# Patient Record
Sex: Female | Born: 2000 | Hispanic: Yes | Marital: Married | Smoking: Never smoker
Health system: Southern US, Community
[De-identification: ages and names within clinical notes are randomized; demographics above are authoritative.]

## PROBLEM LIST (undated history)

## (undated) DIAGNOSIS — R7303 Prediabetes: Secondary | ICD-10-CM

## (undated) HISTORY — DX: Prediabetes: R73.03

## (undated) HISTORY — PX: TONSILLECTOMY: SUR1361

---
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
## 2019-01-10 DIAGNOSIS — Z3400 Encounter for supervision of normal first pregnancy, unspecified trimester: Secondary | ICD-10-CM | POA: Diagnosis not present

## 2019-02-27 DIAGNOSIS — Z3A35 35 weeks gestation of pregnancy: Secondary | ICD-10-CM | POA: Diagnosis not present

## 2019-02-27 DIAGNOSIS — Z3403 Encounter for supervision of normal first pregnancy, third trimester: Secondary | ICD-10-CM | POA: Diagnosis not present

## 2019-03-07 DIAGNOSIS — O26843 Uterine size-date discrepancy, third trimester: Secondary | ICD-10-CM | POA: Diagnosis not present

## 2019-03-24 DIAGNOSIS — O99713 Diseases of the skin and subcutaneous tissue complicating pregnancy, third trimester: Secondary | ICD-10-CM | POA: Diagnosis not present

## 2019-03-24 DIAGNOSIS — L299 Pruritus, unspecified: Secondary | ICD-10-CM | POA: Diagnosis not present

## 2019-03-27 DIAGNOSIS — K831 Obstruction of bile duct: Secondary | ICD-10-CM | POA: Diagnosis not present

## 2019-03-27 DIAGNOSIS — O26613 Liver and biliary tract disorders in pregnancy, third trimester: Secondary | ICD-10-CM | POA: Diagnosis not present

## 2019-03-29 DIAGNOSIS — O9852 Other viral diseases complicating childbirth: Secondary | ICD-10-CM | POA: Diagnosis not present

## 2019-03-29 DIAGNOSIS — O26619 Liver and biliary tract disorders in pregnancy, unspecified trimester: Secondary | ICD-10-CM | POA: Diagnosis not present

## 2019-03-29 DIAGNOSIS — K831 Obstruction of bile duct: Secondary | ICD-10-CM | POA: Diagnosis not present

## 2019-06-19 ENCOUNTER — Other Ambulatory Visit: Payer: Self-pay

## 2019-06-19 ENCOUNTER — Encounter: Payer: Self-pay | Admitting: Emergency Medicine

## 2019-06-19 DIAGNOSIS — R509 Fever, unspecified: Secondary | ICD-10-CM | POA: Diagnosis present

## 2019-06-19 DIAGNOSIS — Z79899 Other long term (current) drug therapy: Secondary | ICD-10-CM | POA: Diagnosis not present

## 2019-06-19 DIAGNOSIS — Z20828 Contact with and (suspected) exposure to other viral communicable diseases: Secondary | ICD-10-CM | POA: Insufficient documentation

## 2019-06-19 DIAGNOSIS — J069 Acute upper respiratory infection, unspecified: Secondary | ICD-10-CM | POA: Insufficient documentation

## 2019-06-19 DIAGNOSIS — E860 Dehydration: Secondary | ICD-10-CM | POA: Diagnosis not present

## 2019-06-19 DIAGNOSIS — R531 Weakness: Secondary | ICD-10-CM | POA: Diagnosis not present

## 2019-06-19 LAB — URINALYSIS, COMPLETE (UACMP) WITH MICROSCOPIC
Bacteria, UA: NONE SEEN
Bilirubin Urine: NEGATIVE
Glucose, UA: NEGATIVE mg/dL
Hgb urine dipstick: NEGATIVE
Ketones, ur: NEGATIVE mg/dL
Nitrite: NEGATIVE
Protein, ur: NEGATIVE mg/dL
Specific Gravity, Urine: 1.018 (ref 1.005–1.030)
pH: 7 (ref 5.0–8.0)

## 2019-06-19 LAB — CBC WITH DIFFERENTIAL/PLATELET
Abs Immature Granulocytes: 0.02 10*3/uL (ref 0.00–0.07)
Basophils Absolute: 0 10*3/uL (ref 0.0–0.1)
Basophils Relative: 0 %
Eosinophils Absolute: 0 10*3/uL (ref 0.0–0.5)
Eosinophils Relative: 0 %
HCT: 40.7 % (ref 36.0–46.0)
Hemoglobin: 13.5 g/dL (ref 12.0–15.0)
Immature Granulocytes: 0 %
Lymphocytes Relative: 13 %
Lymphs Abs: 0.9 10*3/uL (ref 0.7–4.0)
MCH: 28.7 pg (ref 26.0–34.0)
MCHC: 33.2 g/dL (ref 30.0–36.0)
MCV: 86.4 fL (ref 80.0–100.0)
Monocytes Absolute: 0.5 10*3/uL (ref 0.1–1.0)
Monocytes Relative: 7 %
Neutro Abs: 5.4 10*3/uL (ref 1.7–7.7)
Neutrophils Relative %: 80 %
Platelets: 302 10*3/uL (ref 150–400)
RBC: 4.71 MIL/uL (ref 3.87–5.11)
RDW: 11.7 % (ref 11.5–15.5)
WBC: 6.8 10*3/uL (ref 4.0–10.5)
nRBC: 0 % (ref 0.0–0.2)

## 2019-06-19 LAB — BASIC METABOLIC PANEL
Anion gap: 8 (ref 5–15)
BUN: 12 mg/dL (ref 6–20)
CO2: 25 mmol/L (ref 22–32)
Calcium: 11.9 mg/dL — ABNORMAL HIGH (ref 8.9–10.3)
Chloride: 104 mmol/L (ref 98–111)
Creatinine, Ser: 0.83 mg/dL (ref 0.44–1.00)
GFR calc Af Amer: >60 mL/min (ref >60)
GFR calc non Af Amer: >60 mL/min (ref >60)
Glucose, Bld: 104 mg/dL — ABNORMAL HIGH (ref 70–99)
Potassium: 3.8 mmol/L (ref 3.5–5.1)
Sodium: 137 mmol/L (ref 135–145)

## 2019-06-19 LAB — POCT PREGNANCY, URINE: Preg Test, Ur: NEGATIVE

## 2019-06-19 MED ORDER — ACETAMINOPHEN 325 MG PO TABS
650.0000 mg | ORAL_TABLET | Freq: Once | ORAL | Status: AC | PRN
  Administered 2019-06-19: 650 mg via ORAL
  Filled 2019-06-19: qty 2

## 2019-06-19 NOTE — ED Triage Notes (Addendum)
Pt present to ED sudden onset of body aches, dizziness, chills, and fever this morning. +nausea. Denies vomiting. Denies exposure to anyone who is sick. +cough ("a little"). Pt states she did test positive for COVID when she went into labor (almost 3 months ago) but had no symptoms.

## 2019-06-20 ENCOUNTER — Emergency Department: Payer: Medicaid Other

## 2019-06-20 ENCOUNTER — Emergency Department
Admission: EM | Admit: 2019-06-20 | Discharge: 2019-06-20 | Disposition: A | Discharge status: Home / Self Care | Payer: Medicaid Other | Attending: Emergency Medicine | Admitting: Emergency Medicine

## 2019-06-20 ENCOUNTER — Telehealth: Payer: Self-pay | Admitting: General Practice

## 2019-06-20 DIAGNOSIS — J069 Acute upper respiratory infection, unspecified: Secondary | ICD-10-CM

## 2019-06-20 DIAGNOSIS — R509 Fever, unspecified: Secondary | ICD-10-CM | POA: Diagnosis not present

## 2019-06-20 DIAGNOSIS — E860 Dehydration: Secondary | ICD-10-CM

## 2019-06-20 DIAGNOSIS — R05 Cough: Secondary | ICD-10-CM | POA: Diagnosis not present

## 2019-06-20 LAB — HEPATIC FUNCTION PANEL
ALT: 40 U/L (ref 0–44)
AST: 39 U/L (ref 15–41)
Albumin: 4.5 g/dL (ref 3.5–5.0)
Alkaline Phosphatase: 152 U/L — ABNORMAL HIGH (ref 38–126)
Bilirubin, Direct: <0.1 mg/dL (ref 0.0–0.2)
Total Bilirubin: 0.7 mg/dL (ref 0.3–1.2)
Total Protein: 7.9 g/dL (ref 6.5–8.1)

## 2019-06-20 LAB — SARS CORONAVIRUS 2 BY RT PCR (HOSPITAL ORDER, PERFORMED IN ~~LOC~~ HOSPITAL LAB): SARS Coronavirus 2: NEGATIVE

## 2019-06-20 MED ORDER — AZITHROMYCIN 250 MG PO TABS: ORAL_TABLET | ORAL | 0 refills | Status: DC

## 2019-06-20 MED ORDER — BENZONATATE 100 MG PO CAPS: 100.0000 mg | ORAL_CAPSULE | Freq: Three times a day (TID) | ORAL | 0 refills | Status: DC | PRN

## 2019-06-20 MED ORDER — ONDANSETRON 4 MG PO TBDP: 4.0000 mg | ORAL_TABLET | Freq: Three times a day (TID) | ORAL | 0 refills | Status: DC | PRN

## 2019-06-20 NOTE — Telephone Encounter (Signed)
Negative COVID results given. Patient results "NOT Detected." Caller expressed understanding. ° °

## 2019-06-20 NOTE — ED Provider Notes (Signed)
[facility] Emergency Department Provider Note  ____________________________________________   First MD Initiated Contact with Patient 06/20/19 0320     (approximate)  I have reviewed the triage vital signs and the nursing notes.   HISTORY  Chief Complaint Fever, Chills, Generalized Body Aches, and Dizziness    HPI Kara House is a 18 y.o. female here with fever and chills.  The patient states that that she was in her usual state of health until this morning.  She developed gradual onset of progressively worsening chills, body aches, and mild cough.  The symptoms seemed to begin after she was drinking coffee, but she denies any choking.  She states that intermittently, she has felt these recurrent chills and fever.  She is had some mild myalgias as well.  She denies any known sick contacts.  She does have a 20-month-old child, but the child is not in daycare.  She denies any vaginal bleeding or discharge.  She is had no complications after delivery.  She not thinks any urinary symptoms.  She does have a reported history of pneumonia, which she states felt somewhat similar to this.  No known coronavirus exposures.  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  She had no symptoms at that time.        History reviewed. No pertinent past medical history.  There are no active problems to display for this patient.   Past Surgical History:  Procedure Laterality Date  . TONSILLECTOMY      Prior to Admission medications   Medication Sig Start Date End Date Taking? Authorizing Provider  azithromycin (ZITHROMAX Z-PAK) 250 MG tablet Take 2 tablets (500 mg) on  Day 1,  followed by 1 tablet (250 mg) once daily on Days 2 through 5. 06/20/19   Duffy Bruce, MD  benzonatate (TESSALON PERLES) 100 MG capsule Take 1 capsule (100 mg total) by mouth 3 (three) times daily as needed for cough. 06/20/19 06/19/20  Duffy Bruce, MD   ondansetron (ZOFRAN ODT) 4 MG disintegrating tablet Take 1 tablet (4 mg total) by mouth every 8 (eight) hours as needed for nausea or vomiting. 06/20/19   Duffy Bruce, MD    Allergies Patient has no known allergies.  No family history on file.  Social History Social History   Tobacco Use  . Smoking status: Never Smoker  . Smokeless tobacco: Never Used  Substance Use Topics  . Alcohol use: Yes    Frequency: Never  . Drug use: Not on file    Review of Systems  Review of Systems  Constitutional: Positive for chills, fatigue and fever.  HENT: Negative for congestion and sore throat.   Eyes: Negative for visual disturbance.  Respiratory: Positive for cough. Negative for shortness of breath.   Cardiovascular: Negative for chest pain.  Gastrointestinal: Positive for nausea. Negative for abdominal pain, diarrhea and vomiting.  Genitourinary: Negative for flank pain.  Musculoskeletal: Negative for back pain and neck pain.  Skin: Negative for rash and wound.  Neurological: Positive for weakness.  All other systems reviewed and are negative.    ____________________________________________  PHYSICAL EXAM:      VITAL SIGNS: ED Triage Vitals  Enc Vitals Group     BP 06/19/19 2234 131/68     Pulse Rate 06/19/19 2234 (!) 138     Resp 06/19/19 2234 18     Temp 06/19/19 2234 (!) 102.8 F (39.3 C)     Temp Source  06/19/19 2234 Oral     SpO2 06/19/19 2234 99 %     Weight 06/19/19 2239 181 lb (82.1 kg)     Height 06/19/19 2239 5\' 2"  (1.575 m)     Head Circumference --      Peak Flow --      Pain Score 06/19/19 2238 10     Pain Loc --      Pain Edu? --      Excl. in GC? --      Physical Exam Vitals signs and nursing note reviewed.  Constitutional:      General: She is not in acute distress.    Appearance: She is well-developed.  HENT:     Head: Normocephalic and atraumatic.  Eyes:     Conjunctiva/sclera: Conjunctivae normal.  Neck:     Musculoskeletal: Neck supple.   Cardiovascular:     Rate and Rhythm: Regular rhythm. Tachycardia present.     Heart sounds: Normal heart sounds. No murmur. No friction rub.  Pulmonary:     Effort: Pulmonary effort is normal. No respiratory distress.     Breath sounds: Rales (mild, basilar) present. No wheezing.  Abdominal:     General: There is no distension.     Palpations: Abdomen is soft.     Tenderness: There is no abdominal tenderness.     Comments: No focal TTP. Specifically, no RUQ or RLQ TTP.  Skin:    General: Skin is warm.     Capillary Refill: Capillary refill takes less than 2 seconds.  Neurological:     Mental Status: She is alert and oriented to person, place, and time.     Motor: No abnormal muscle tone.       ____________________________________________   LABS (all labs ordered are listed, but only abnormal results are displayed)  Labs Reviewed  BASIC METABOLIC PANEL - Abnormal; Notable for the following components:      Result Value   Glucose, Bld 104 (*)    Calcium 11.9 (*)    All other components within normal limits  URINALYSIS, COMPLETE (UACMP) WITH MICROSCOPIC - Abnormal; Notable for the following components:   Color, Urine YELLOW (*)    APPearance HAZY (*)    Leukocytes,Ua MODERATE (*)    All other components within normal limits  HEPATIC FUNCTION PANEL - Abnormal; Notable for the following components:   Alkaline Phosphatase 152 (*)    All other components within normal limits  SARS CORONAVIRUS 2 (HOSPITAL ORDER, PERFORMED IN [facility])  CBC WITH DIFFERENTIAL/PLATELET  POC URINE PREG, ED  POCT PREGNANCY, URINE    ____________________________________________  EKG: Sinus tachycardia, VR 132. PR 132, QRS 106. No acute ischemic changes. No RBBB or RAD. ________________________________________  RADIOLOGY All imaging, including plain films, CT scans, and ultrasounds, independently reviewed by me, and interpretations confirmed via formal radiology reads.  ED MD  interpretation:   CXR: Clear, no PNA  Official radiology report(s): Dg Chest 1 View  Result Date: 06/20/2019 CLINICAL DATA:  18 year old female with sudden onset fever, cough, body ache, dizziness. Three months ago was positive for COVID-19. EXAM: CHEST  1 VIEW COMPARISON:  None. FINDINGS: Portable AP upright view at 0337 hours. Lung volumes and mediastinal contours are within normal limits. Visualized tracheal air column is within normal limits. Allowing for portable technique the lungs are clear. No osseous abnormality identified. Negative visible bowel gas pattern. IMPRESSION: Negative portable chest. Electronically Signed   By: Odessa FlemingH  Hall M.D.   On: 06/20/2019  04:23    ____________________________________________  PROCEDURES   Procedure(s) performed (including Critical Care):  Procedures  ____________________________________________  INITIAL IMPRESSION / MDM / ASSESSMENT AND PLAN / ED COURSE  As part of my medical decision making, I reviewed the following data within the electronic MEDICAL RECORD NUMBER Notes from prior ED visits and [place] Controlled Substance Database      *Madissyn Roberds was evaluated in Emergency Department on 06/20/2019 for the symptoms described in the history of present illness. She was evaluated in the context of the global COVID-19 pandemic, which necessitated consideration that the patient might be at risk for infection with the SARS-CoV-2 virus that causes COVID-19. Institutional protocols and algorithms that pertain to the evaluation of patients at risk for COVID-19 are in a state of rapid change based on information released by regulatory bodies including the CDC and federal and state organizations. These policies and algorithms were followed during the patient's care in the ED.  Some ED evaluations and interventions may be delayed as a result of limited staffing during the pandemic.*   Clinical Course as of Jun 19 534  Tue Jun 20, 2019  0506 18 yo F here with  cough, fever, chills. Pt febrile, tachycardic on arrival but not hypoxic. Suspect viral illness, possible early CAP. COVID-19 a primary consideration as well. No signs of resp distress or failure. UA with mild pyuria but she has absolutely no urinary sx - doubt UTI. No vaginal bleeding, discharge, or sx of PID. CBC, BMP normal. LFTs normal, no abd TTP to suggest cholecystitis, appendicitis, diverticulitis. Will tx for possible early CAP given her history, d/c with close PCP f/u. Outpt COVID test sent.   [CI]    Clinical Course User Index [CI] Shaune Pollack, MD    Medical Decision Making: As above. Very well appearing, feels better after tylenol. Tolerating PO. No abd TTP.  ____________________________________________  FINAL CLINICAL IMPRESSION(S) / ED DIAGNOSES  Final diagnoses:  Upper respiratory tract infection, unspecified type  Dehydration     MEDICATIONS GIVEN DURING THIS VISIT:  Medications  acetaminophen (TYLENOL) tablet 650 mg (650 mg Oral Given 06/19/19 2251)     ED Discharge Orders         Ordered    azithromycin (ZITHROMAX Z-PAK) 250 MG tablet  Status:  Discontinued     06/20/19 0508    azithromycin (ZITHROMAX Z-PAK) 250 MG tablet     06/20/19 0509    benzonatate (TESSALON PERLES) 100 MG capsule  3 times daily PRN     06/20/19 0509    ondansetron (ZOFRAN ODT) 4 MG disintegrating tablet  Every 8 hours PRN     06/20/19 0509           Note:  This document was prepared using Dragon voice recognition software and may include unintentional dictation errors.   Shaune Pollack, MD 06/20/19 [phone]

## 2019-06-20 NOTE — ED Notes (Addendum)
Pt sitting in lobby, mask in place with no distress noted; reports feeling better after taking tylenol; vss retaken and updated on wait time; reports having lower back pain "like when I had my epidural"; pt given water at request

## 2019-06-20 NOTE — Discharge Instructions (Addendum)
Drink at least 8 glasses of wate rdaily  Take the antibiotics as prescribed  For now, take Tylenol extra strength for fever, body aches.  If your Coronavirus test is negative, take Ibuprofen/motrin 600 mg (3 tabs) every 8 hours for body aches and fever.

## 2019-08-06 DIAGNOSIS — Z3201 Encounter for pregnancy test, result positive: Secondary | ICD-10-CM | POA: Diagnosis not present

## 2019-08-06 DIAGNOSIS — R000 Tachycardia, unspecified: Secondary | ICD-10-CM | POA: Diagnosis not present

## 2019-08-06 DIAGNOSIS — R350 Frequency of micturition: Secondary | ICD-10-CM | POA: Diagnosis not present

## 2019-08-06 DIAGNOSIS — Z23 Encounter for immunization: Secondary | ICD-10-CM | POA: Diagnosis not present

## 2019-08-06 DIAGNOSIS — Z3200 Encounter for pregnancy test, result unknown: Secondary | ICD-10-CM | POA: Diagnosis not present

## 2019-08-29 ENCOUNTER — Ambulatory Visit (INDEPENDENT_AMBULATORY_CARE_PROVIDER_SITE_OTHER): Payer: Medicaid Other | Admitting: Podiatry

## 2019-08-29 ENCOUNTER — Other Ambulatory Visit: Payer: Self-pay

## 2019-08-29 ENCOUNTER — Encounter: Payer: Self-pay | Admitting: Podiatry

## 2019-08-29 VITALS — BP 134/86 | HR 110

## 2019-08-29 DIAGNOSIS — M79671 Pain in right foot: Secondary | ICD-10-CM

## 2019-08-29 DIAGNOSIS — L600 Ingrowing nail: Secondary | ICD-10-CM

## 2019-08-29 NOTE — Progress Notes (Signed)
  Subjective:  Patient ID: Kara House, female    DOB: [birth year],  MRN: [id number]  Chief Complaint  Patient presents with  . Ingrown Toenail    right great toenail, lateral border removal    18 y.o. female presents with the above complaint.  Patient states the right great toe lateral border has been very painful.  It hurts when ambulating.  She has not done anything besides Epson salt soaks which has not helped.  She states that this has been going on for couple of months now.  She states she has tried taking Atarax but has not helped.  If anything this has made it worse.   Review of Systems: Negative except as noted in the HPI. Denies N/V/F/Ch.  No past medical history on file.  Current Outpatient Medications:  .  Prenatal Vit-Fe Fumarate-FA (PRENATAL VITAMIN PO), Take by mouth., Disp: , Rfl:  .  UNABLE TO FIND, Take by mouth., Disp: , Rfl:   Social History   Tobacco Use  Smoking Status Never Smoker  Smokeless Tobacco Never Used    No Known Allergies Objective:   Vitals:   08/29/19 0910  BP: 134/86  Pulse: (!) 110   There is no height or weight on file to calculate BMI. Constitutional Well developed. Well nourished.  Vascular Dorsalis pedis pulses palpable bilaterally. Posterior tibial pulses palpable bilaterally. Capillary refill normal to all digits.  No cyanosis or clubbing noted. Pedal hair growth normal.  Neurologic Normal speech. Oriented to person, place, and time. Epicritic sensation to light touch grossly present bilaterally.  Dermatologic Painful ingrowing nail at lateral nail borders of the hallux nail right. No other open wounds. No skin lesions.  Orthopedic: Normal joint ROM without pain or crepitus bilaterally. No visible deformities. No bony tenderness.   Radiographs: None Assessment:  No diagnosis found. Plan:  Patient was evaluated and treated and all questions answered.  Ingrown Nail, right -Patient elects to proceed with minor  surgery to remove ingrown toenail removal today. Consent reviewed and signed by patient. -Ingrown nail excised. See procedure note. -Educated on post-procedure care including soaking. Written instructions provided and reviewed. -Patient to follow up in 2 weeks for nail check.  Procedure: Excision of Ingrown Toenail Location: Right 1st toe lateral nail borders. Anesthesia: Lidocaine 1% plain; 1.5 mL and Marcaine 0.5% plain; 1.5 mL, digital block. Skin Prep: Betadine. Dressing: Silvadene; telfa; dry, sterile, compression dressing. Technique: Following skin prep, the toe was exsanguinated and a tourniquet was secured at the base of the toe. The affected nail border was freed, split with a nail splitter, and excised. Chemical matrixectomy was then performed with phenol and irrigated out with alcohol. The tourniquet was then removed and sterile dressing applied. Disposition: Patient tolerated procedure well. Patient to return in 2 weeks for follow-up.   No follow-ups on file.

## 2019-09-12 ENCOUNTER — Ambulatory Visit: Payer: Medicaid Other | Admitting: Podiatry

## 2019-09-19 ENCOUNTER — Ambulatory Visit: Payer: Medicaid Other | Admitting: Podiatry

## 2019-09-19 ENCOUNTER — Other Ambulatory Visit: Payer: Self-pay

## 2019-09-19 ENCOUNTER — Encounter: Payer: Self-pay | Admitting: Podiatry

## 2019-09-19 ENCOUNTER — Ambulatory Visit (INDEPENDENT_AMBULATORY_CARE_PROVIDER_SITE_OTHER): Payer: Medicaid Other | Admitting: Podiatry

## 2019-09-19 DIAGNOSIS — L600 Ingrowing nail: Secondary | ICD-10-CM

## 2019-09-19 DIAGNOSIS — Z3401 Encounter for supervision of normal first pregnancy, first trimester: Secondary | ICD-10-CM | POA: Diagnosis not present

## 2019-09-19 DIAGNOSIS — O3680X0 Pregnancy with inconclusive fetal viability, not applicable or unspecified: Secondary | ICD-10-CM | POA: Diagnosis not present

## 2019-09-19 DIAGNOSIS — M79675 Pain in left toe(s): Secondary | ICD-10-CM

## 2019-09-19 NOTE — Patient Instructions (Signed)

## 2019-09-19 NOTE — Progress Notes (Signed)
  Subjective:  Patient ID: Kara House, female    DOB: 01-23-2001,  MRN: [id number]  Chief Complaint  Patient presents with  . Nail Problem    pt is here for a f/u of the big toenail of the right big toenail, pt states that she is doing alot better when it comes to the right big toenail, but is concerned that the left big toenail lateral side is possibly infected as well    18 y.o. female presents with the above complaint.  Patient presents with a follow-up on the right ingrown toenail.  Patient states she is doing really well.  She denies any pain.  She denies any other acute complaints.  She is here also to have her left lateral border ingrown taking care of as well.  She states is been hurting her when ambulating.  She denies any other acute complaints at this time.   Review of Systems: Negative except as noted in the HPI. Denies N/V/F/Ch.  No past medical history on file.  Current Outpatient Medications:  .  Prenatal Vit-Fe Fumarate-FA (PRENATAL VITAMIN PO), Take by mouth., Disp: , Rfl:   Social History   Tobacco Use  Smoking Status Never Smoker  Smokeless Tobacco Never Used    No Known Allergies Objective:  There were no vitals filed for this visit. There is no height or weight on file to calculate BMI. Constitutional Well developed. Well nourished.  Vascular Dorsalis pedis pulses palpable bilaterally. Posterior tibial pulses palpable bilaterally. Capillary refill normal to all digits.  No cyanosis or clubbing noted. Pedal hair growth normal.  Neurologic Normal speech. Oriented to person, place, and time. Epicritic sensation to light touch grossly present bilaterally.  Dermatologic Painful ingrowing nail at lateral nail borders of the hallux nail left. No other open wounds. No skin lesions.  Orthopedic: Normal joint ROM without pain or crepitus bilaterally. No visible deformities. No bony tenderness.   Radiographs: None Assessment:  No diagnosis found. Plan:   Patient was evaluated and treated and all questions answered.  Ingrown Nail, left -Patient elects to proceed with minor surgery to remove ingrown toenail removal today. Consent reviewed and signed by patient. -Ingrown nail excised. See procedure note. -Educated on post-procedure care including soaking. Written instructions provided and reviewed. -Patient to follow up in 2 weeks for nail check.  Procedure: Excision of Ingrown Toenail Location: Left 1st toe lateral nail borders. Anesthesia: Lidocaine 1% plain; 1.5 mL and Marcaine 0.5% plain; 1.5 mL, digital block. Skin Prep: Betadine. Dressing: Silvadene; telfa; dry, sterile, compression dressing. Technique: Following skin prep, the toe was exsanguinated and a tourniquet was secured at the base of the toe. The affected nail border was freed, split with a nail splitter, and excised. Chemical matrixectomy was then performed with phenol and irrigated out with alcohol. The tourniquet was then removed and sterile dressing applied. Disposition: Patient tolerated procedure well. Patient to return in 2 weeks for follow-up.   No follow-ups on file.

## 2019-09-28 DIAGNOSIS — Z3A11 11 weeks gestation of pregnancy: Secondary | ICD-10-CM | POA: Diagnosis not present

## 2019-09-28 DIAGNOSIS — O09891 Supervision of other high risk pregnancies, first trimester: Secondary | ICD-10-CM | POA: Diagnosis not present

## 2019-10-02 DIAGNOSIS — Z3481 Encounter for supervision of other normal pregnancy, first trimester: Secondary | ICD-10-CM | POA: Diagnosis not present

## 2019-10-02 DIAGNOSIS — Z6835 Body mass index (BMI) 35.0-35.9, adult: Secondary | ICD-10-CM | POA: Diagnosis not present

## 2019-10-27 DIAGNOSIS — Z3481 Encounter for supervision of other normal pregnancy, first trimester: Secondary | ICD-10-CM | POA: Diagnosis not present

## 2019-10-27 DIAGNOSIS — Z3480 Encounter for supervision of other normal pregnancy, unspecified trimester: Secondary | ICD-10-CM | POA: Diagnosis not present

## 2019-10-27 DIAGNOSIS — Z3A15 15 weeks gestation of pregnancy: Secondary | ICD-10-CM | POA: Diagnosis not present

## 2019-12-01 DIAGNOSIS — O99212 Obesity complicating pregnancy, second trimester: Secondary | ICD-10-CM | POA: Diagnosis not present

## 2019-12-01 DIAGNOSIS — Z3689 Encounter for other specified antenatal screening: Secondary | ICD-10-CM | POA: Diagnosis not present

## 2019-12-01 DIAGNOSIS — Z6835 Body mass index (BMI) 35.0-35.9, adult: Secondary | ICD-10-CM | POA: Diagnosis not present

## 2019-12-01 DIAGNOSIS — E669 Obesity, unspecified: Secondary | ICD-10-CM | POA: Diagnosis not present

## 2019-12-01 DIAGNOSIS — O0992 Supervision of high risk pregnancy, unspecified, second trimester: Secondary | ICD-10-CM | POA: Diagnosis not present

## 2019-12-01 DIAGNOSIS — Z3A20 20 weeks gestation of pregnancy: Secondary | ICD-10-CM | POA: Diagnosis not present

## 2020-01-05 DIAGNOSIS — L299 Pruritus, unspecified: Secondary | ICD-10-CM | POA: Diagnosis not present

## 2020-01-05 DIAGNOSIS — Z8759 Personal history of other complications of pregnancy, childbirth and the puerperium: Secondary | ICD-10-CM | POA: Diagnosis not present

## 2020-01-05 DIAGNOSIS — Z8719 Personal history of other diseases of the digestive system: Secondary | ICD-10-CM | POA: Diagnosis not present

## 2020-02-02 DIAGNOSIS — Z3480 Encounter for supervision of other normal pregnancy, unspecified trimester: Secondary | ICD-10-CM | POA: Diagnosis not present

## 2020-02-16 DIAGNOSIS — Z8719 Personal history of other diseases of the digestive system: Secondary | ICD-10-CM | POA: Diagnosis not present

## 2020-02-16 DIAGNOSIS — Z6839 Body mass index (BMI) 39.0-39.9, adult: Secondary | ICD-10-CM | POA: Insufficient documentation

## 2020-02-16 DIAGNOSIS — Z8759 Personal history of other complications of pregnancy, childbirth and the puerperium: Secondary | ICD-10-CM | POA: Diagnosis not present

## 2020-02-16 DIAGNOSIS — Z6841 Body Mass Index (BMI) 40.0 and over, adult: Secondary | ICD-10-CM | POA: Insufficient documentation

## 2020-02-16 DIAGNOSIS — E66813 Obesity, class 3: Secondary | ICD-10-CM | POA: Insufficient documentation

## 2020-02-16 DIAGNOSIS — L299 Pruritus, unspecified: Secondary | ICD-10-CM | POA: Diagnosis not present

## 2020-02-26 DIAGNOSIS — L299 Pruritus, unspecified: Secondary | ICD-10-CM | POA: Diagnosis not present

## 2020-03-08 DIAGNOSIS — O26899 Other specified pregnancy related conditions, unspecified trimester: Secondary | ICD-10-CM | POA: Diagnosis not present

## 2020-03-08 DIAGNOSIS — R102 Pelvic and perineal pain: Secondary | ICD-10-CM | POA: Diagnosis not present

## 2020-03-22 DIAGNOSIS — Z3689 Encounter for other specified antenatal screening: Secondary | ICD-10-CM | POA: Diagnosis not present

## 2020-03-22 DIAGNOSIS — E669 Obesity, unspecified: Secondary | ICD-10-CM | POA: Diagnosis not present

## 2020-03-22 DIAGNOSIS — O99213 Obesity complicating pregnancy, third trimester: Secondary | ICD-10-CM | POA: Diagnosis not present

## 2020-03-22 DIAGNOSIS — Z3A36 36 weeks gestation of pregnancy: Secondary | ICD-10-CM | POA: Diagnosis not present

## 2020-03-22 DIAGNOSIS — Z3480 Encounter for supervision of other normal pregnancy, unspecified trimester: Secondary | ICD-10-CM | POA: Diagnosis not present

## 2020-03-29 DIAGNOSIS — Z3A15 15 weeks gestation of pregnancy: Secondary | ICD-10-CM | POA: Diagnosis not present

## 2020-03-29 DIAGNOSIS — Z3481 Encounter for supervision of other normal pregnancy, first trimester: Secondary | ICD-10-CM | POA: Diagnosis not present

## 2020-04-02 DIAGNOSIS — O133 Gestational [pregnancy-induced] hypertension without significant proteinuria, third trimester: Secondary | ICD-10-CM | POA: Diagnosis not present

## 2020-04-02 DIAGNOSIS — Z3A38 38 weeks gestation of pregnancy: Secondary | ICD-10-CM | POA: Diagnosis not present

## 2020-05-16 DIAGNOSIS — R21 Rash and other nonspecific skin eruption: Secondary | ICD-10-CM | POA: Diagnosis not present

## 2020-12-08 IMAGING — DX DG CHEST 1V
1 series · 1 of 1 positions shown · non-contrast
Comparison: None.

CLINICAL DATA: 18-year-old female with sudden onset fever, cough,
body ache, dizziness. Three months ago was positive for DOZPH-W4.

EXAM:
CHEST  1 VIEW

[chest ap]
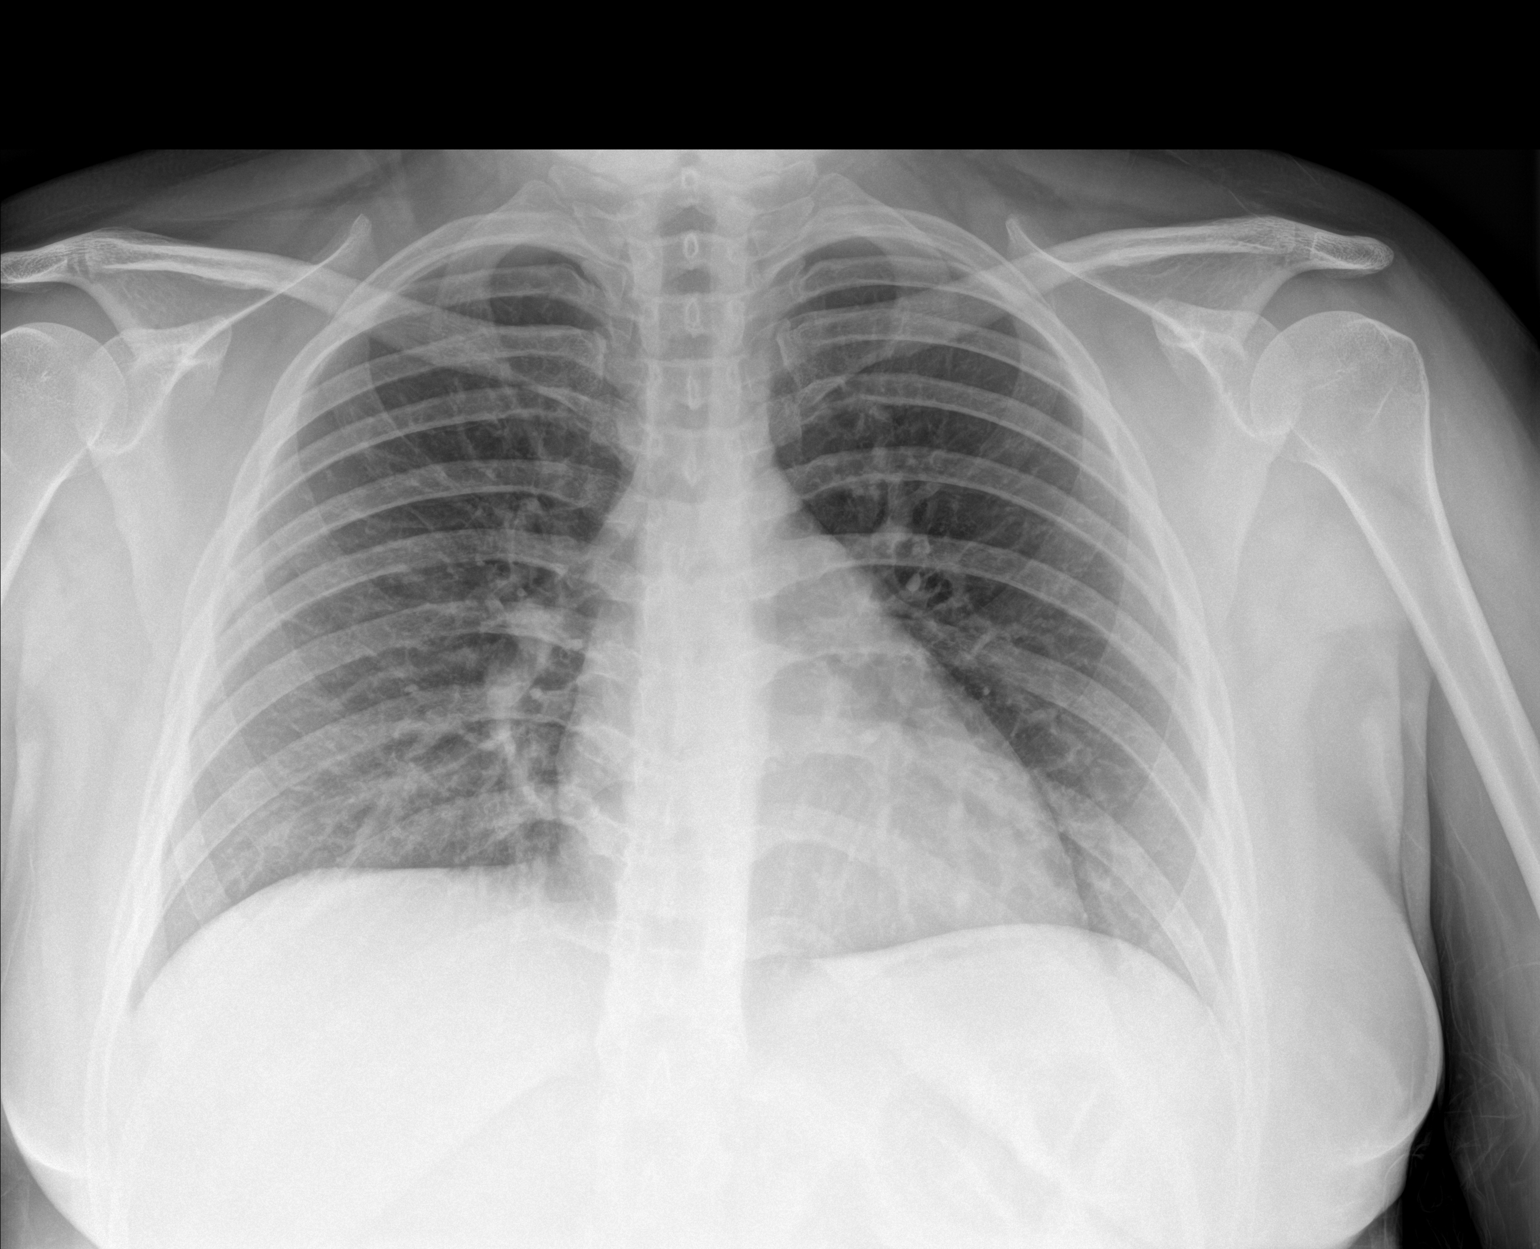

[1 of 1 positions shown; findings below may reference images not displayed]

FINDINGS: Portable AP upright view at 5447 hours. Lung volumes and mediastinal
contours are within normal limits. Visualized tracheal air column is
within normal limits. Allowing for portable technique the lungs are
clear. No osseous abnormality identified. Negative visible bowel gas
pattern.
IMPRESSION: Negative portable chest.

## 2020-12-21 ENCOUNTER — Other Ambulatory Visit: Payer: Self-pay

## 2020-12-21 ENCOUNTER — Encounter: Payer: Self-pay | Admitting: Intensive Care

## 2020-12-21 ENCOUNTER — Emergency Department
Admission: EM | Admit: 2020-12-21 | Discharge: 2020-12-21 | Disposition: A | Discharge status: Home / Self Care | Payer: Medicaid Other | Attending: Emergency Medicine | Admitting: Emergency Medicine

## 2020-12-21 ENCOUNTER — Emergency Department: Payer: Medicaid Other

## 2020-12-21 DIAGNOSIS — M791 Myalgia, unspecified site: Secondary | ICD-10-CM | POA: Diagnosis not present

## 2020-12-21 DIAGNOSIS — R519 Headache, unspecified: Secondary | ICD-10-CM | POA: Insufficient documentation

## 2020-12-21 DIAGNOSIS — Z20822 Contact with and (suspected) exposure to covid-19: Secondary | ICD-10-CM | POA: Insufficient documentation

## 2020-12-21 DIAGNOSIS — R0602 Shortness of breath: Secondary | ICD-10-CM | POA: Diagnosis not present

## 2020-12-21 DIAGNOSIS — R509 Fever, unspecified: Secondary | ICD-10-CM

## 2020-12-21 LAB — COMPREHENSIVE METABOLIC PANEL
ALT: 31 U/L (ref 0–44)
AST: 25 U/L (ref 15–41)
Albumin: 4.3 g/dL (ref 3.5–5.0)
Alkaline Phosphatase: 160 U/L — ABNORMAL HIGH (ref 38–126)
Anion gap: 7 (ref 5–15)
BUN: 12 mg/dL (ref 6–20)
CO2: 22 mmol/L (ref 22–32)
Calcium: 11.7 mg/dL — ABNORMAL HIGH (ref 8.9–10.3)
Chloride: 106 mmol/L (ref 98–111)
Creatinine, Ser: 0.65 mg/dL (ref 0.44–1.00)
GFR, Estimated: >60 mL/min (ref >60)
Glucose, Bld: 106 mg/dL — ABNORMAL HIGH (ref 70–99)
Potassium: 3.8 mmol/L (ref 3.5–5.1)
Sodium: 135 mmol/L (ref 135–145)
Total Bilirubin: 0.6 mg/dL (ref 0.3–1.2)
Total Protein: 7.8 g/dL (ref 6.5–8.1)

## 2020-12-21 LAB — CBC WITH DIFFERENTIAL/PLATELET
Abs Immature Granulocytes: 0.05 10*3/uL (ref 0.00–0.07)
Basophils Absolute: 0 10*3/uL (ref 0.0–0.1)
Basophils Relative: 0 %
Eosinophils Absolute: 0 10*3/uL (ref 0.0–0.5)
Eosinophils Relative: 0 %
HCT: 41.3 % (ref 36.0–46.0)
Hemoglobin: 13.7 g/dL (ref 12.0–15.0)
Immature Granulocytes: 1 %
Lymphocytes Relative: 12 %
Lymphs Abs: 1.2 10*3/uL (ref 0.7–4.0)
MCH: 28.1 pg (ref 26.0–34.0)
MCHC: 33.2 g/dL (ref 30.0–36.0)
MCV: 84.8 fL (ref 80.0–100.0)
Monocytes Absolute: 0.7 10*3/uL (ref 0.1–1.0)
Monocytes Relative: 7 %
Neutro Abs: 8.1 10*3/uL — ABNORMAL HIGH (ref 1.7–7.7)
Neutrophils Relative %: 80 %
Platelets: 295 10*3/uL (ref 150–400)
RBC: 4.87 MIL/uL (ref 3.87–5.11)
RDW: 13.2 % (ref 11.5–15.5)
WBC: 10.1 10*3/uL (ref 4.0–10.5)
nRBC: 0 % (ref 0.0–0.2)

## 2020-12-21 LAB — RESP PANEL BY RT-PCR (FLU A&B, COVID) ARPGX2
Influenza A by PCR: NEGATIVE
Influenza B by PCR: NEGATIVE
SARS Coronavirus 2 by RT PCR: NEGATIVE

## 2020-12-21 LAB — URINALYSIS, COMPLETE (UACMP) WITH MICROSCOPIC
Bilirubin Urine: NEGATIVE
Glucose, UA: NEGATIVE mg/dL
Ketones, ur: NEGATIVE mg/dL
Nitrite: NEGATIVE
Protein, ur: NEGATIVE mg/dL
Specific Gravity, Urine: 1.016 (ref 1.005–1.030)
pH: 7 (ref 5.0–8.0)

## 2020-12-21 LAB — POC URINE PREG, ED: Preg Test, Ur: NEGATIVE

## 2020-12-21 LAB — PROCALCITONIN: Procalcitonin: <0.1 ng/mL

## 2020-12-21 MED ORDER — ALBUTEROL SULFATE HFA 108 (90 BASE) MCG/ACT IN AERS: 2.0000 | INHALATION_SPRAY | Freq: Four times a day (QID) | RESPIRATORY_TRACT | 2 refills | Status: DC | PRN

## 2020-12-21 MED ORDER — SODIUM CHLORIDE 0.9 % IV BOLUS
1000.0000 mL | Freq: Once | INTRAVENOUS | Status: AC
  Administered 2020-12-21: 1000 mL via INTRAVENOUS

## 2020-12-21 MED ORDER — KETOROLAC TROMETHAMINE 30 MG/ML IJ SOLN
30.0000 mg | Freq: Once | INTRAMUSCULAR | Status: AC
  Administered 2020-12-21: 30 mg via INTRAVENOUS
  Filled 2020-12-21: qty 1

## 2020-12-21 MED ORDER — ACETAMINOPHEN 500 MG PO TABS
1000.0000 mg | ORAL_TABLET | Freq: Once | ORAL | Status: AC
  Administered 2020-12-21: 1000 mg via ORAL
  Filled 2020-12-21: qty 2

## 2020-12-21 MED ORDER — KETOROLAC TROMETHAMINE 10 MG PO TABS: 10.0000 mg | ORAL_TABLET | Freq: Three times a day (TID) | ORAL | 0 refills | Status: DC | PRN

## 2020-12-21 NOTE — Discharge Instructions (Addendum)
Please seek medical attention for any high fevers, chest pain, shortness of breath, change in behavior, persistent vomiting, bloody stool or any other new or concerning symptoms.  

## 2020-12-21 NOTE — ED Provider Notes (Signed)
[facility] Emergency Department Provider Note  ____________________________________________   I have reviewed the triage vital signs and the nursing notes.   HISTORY  Chief Complaint Fever and Shortness of Breath   History limited by: Not Limited   HPI Kara House is a 20 y.o. female who presents to the emergency department today because of concern for body aches, fever and shortness of breath. The patient states that she woke up with these symptoms around 3 am in the morning. Prior to going to bed last night the patient did have some headache. Denies any unusual activity yesterday. Today with the shortness of breath she has developed some cough. No vomiting or diarrhea.   Records reviewed. Per medical record review patient has a history of tosillectomy.  History reviewed. No pertinent past medical history.  There are no problems to display for this patient.   Past Surgical History:  Procedure Laterality Date  . TONSILLECTOMY      Prior to Admission medications   Medication Sig Start Date End Date Taking? Authorizing Provider  Prenatal Vit-Fe Fumarate-FA (PRENATAL VITAMIN PO) Take by mouth.    [provider]    Allergies Patient has no known allergies.  History reviewed. No pertinent family history.  Social History Social History   Tobacco Use  . Smoking status: Never Smoker  . Smokeless tobacco: Never Used  Vaping Use  . Vaping Use: Never used  Substance Use Topics  . Alcohol use: Yes  . Drug use: Never    Review of Systems Constitutional: Positive for fever.  Eyes: No visual changes. ENT: Positive for sore throat. Cardiovascular: Denies chest pain. Respiratory: Positive for shortness of breath. Gastrointestinal: No abdominal pain.  No nausea, no vomiting.  No diarrhea.   Genitourinary: Negative for dysuria. Musculoskeletal: Positive for body aches.  Skin: Negative for rash. Neurological: Positive for  headache. ____________________________________________   PHYSICAL EXAM:  VITAL SIGNS: ED Triage Vitals  Enc Vitals Group     BP 12/21/20 1705 (!) 161/68     Pulse Rate 12/21/20 1705 (!) 136     Resp 12/21/20 1705 18     Temp 12/21/20 1705 (!) 101.8 F (38.8 C)     Temp Source 12/21/20 1705 Oral     SpO2 12/21/20 1705 98 %     Weight 12/21/20 1706 198 lb (89.8 kg)     Height 12/21/20 1706 $RemoveBefor'5\' 2"'ASwiUPHeBMWU$  (1.575 m)     Head Circumference --      Peak Flow --      Pain Score 12/21/20 1705 10    Constitutional: Alert and oriented.  Eyes: Conjunctivae are normal.  ENT      Head: Normocephalic and atraumatic.      Nose: No congestion/rhinnorhea.      Mouth/Throat: Mucous membranes are moist.      Neck: No stridor. Hematological/Lymphatic/Immunilogical: No cervical lymphadenopathy. Cardiovascular: Tachycardia, regular rhythm.  No murmurs, rubs, or gallops.  Respiratory: Normal respiratory effort without tachypnea nor retractions. Breath sounds are clear and equal bilaterally. No wheezes/rales/rhonchi. Gastrointestinal: Soft and non tender. No rebound. No guarding.  Genitourinary: Deferred Musculoskeletal: Normal range of motion in all extremities. No lower extremity edema. Neurologic:  Normal speech and language. No gross focal neurologic deficits are appreciated.  Skin:  Skin is warm, dry and intact. No rash noted. Psychiatric: Mood and affect are normal. Speech and behavior are normal. Patient exhibits appropriate insight and judgment.  ____________________________________________    LABS (pertinent positives/negatives)  Upreg negative CMP wnl  except glu 106, ca 11.7, alk phos 160 UA hazy, large hgb dipstick, small leukocytes, 21-50 rbc, 6-10 wbc, 11-20 squamous epis CBC wbc 10.1, hgb 13.7, plt 295 Procalcitonin <0.10 COVID neg ____________________________________________   EKG  I, Nance Pear, attending physician, personally viewed and interpreted this EKG  EKG Time:  1711 Rate: 136 Rhythm: sinus tachycardia Axis: right axis deviation Intervals: qtc 430 QRS: narrow ST changes: no st elevation Impression: abnormal ekg  ____________________________________________    RADIOLOGY  CXR Streaky opacity in left lung base, atelectasis vs early infiltrate  ____________________________________________   PROCEDURES  Procedures  ____________________________________________   INITIAL IMPRESSION / ASSESSMENT AND PLAN / ED COURSE  Pertinent labs & imaging results that were available during my care of the patient were reviewed by me and considered in my medical decision making (see chart for details).   Patient presented to the emergency department today with concerns for fevers, body aches, and shortness of breath.  On initial exam patient was febrile and tachycardic.  Her fever did improve after medication here in the emergency department and her tachycardia improved with defervesced since and fluids.  Blood work without any concerning elevation of procalcitonin or elevated white blood cell count.  Covid was negative.  I do think likely viral illness at this time.  I discussed this with the patient.  Will discharge with symptomatic treatment.  ____________________________________________   FINAL CLINICAL IMPRESSION(S) / ED DIAGNOSES  Final diagnoses:  Fever, unspecified fever cause  Myalgia  Shortness of breath     Note: This dictation was prepared with Dragon dictation. Any transcriptional errors that result from this process are unintentional     Nance Pear, MD 12/21/20 2338

## 2020-12-21 NOTE — ED Notes (Signed)
ED Provider at bedside. 

## 2020-12-21 NOTE — ED Triage Notes (Signed)
Pt c/o sob, dizziness, fever, and body aches that started this AM.

## 2021-04-03 ENCOUNTER — Emergency Department: Payer: Medicaid Other

## 2021-04-03 ENCOUNTER — Emergency Department
Admission: EM | Admit: 2021-04-03 | Discharge: 2021-04-03 | Disposition: A | Discharge status: Home / Self Care | Payer: Medicaid Other | Attending: Emergency Medicine | Admitting: Emergency Medicine

## 2021-04-03 ENCOUNTER — Encounter: Payer: Self-pay | Admitting: Emergency Medicine

## 2021-04-03 ENCOUNTER — Other Ambulatory Visit: Payer: Self-pay

## 2021-04-03 DIAGNOSIS — Z3A01 Less than 8 weeks gestation of pregnancy: Secondary | ICD-10-CM | POA: Insufficient documentation

## 2021-04-03 DIAGNOSIS — O208 Other hemorrhage in early pregnancy: Secondary | ICD-10-CM | POA: Diagnosis not present

## 2021-04-03 DIAGNOSIS — O200 Threatened abortion: Secondary | ICD-10-CM | POA: Diagnosis not present

## 2021-04-03 DIAGNOSIS — O209 Hemorrhage in early pregnancy, unspecified: Secondary | ICD-10-CM

## 2021-04-03 LAB — CBC WITH DIFFERENTIAL/PLATELET
Abs Immature Granulocytes: 0.02 10*3/uL (ref 0.00–0.07)
Basophils Absolute: 0 10*3/uL (ref 0.0–0.1)
Basophils Relative: 0 %
Eosinophils Absolute: 0.1 10*3/uL (ref 0.0–0.5)
Eosinophils Relative: 1 %
HCT: 42.3 % (ref 36.0–46.0)
Hemoglobin: 13.8 g/dL (ref 12.0–15.0)
Immature Granulocytes: 0 %
Lymphocytes Relative: 32 %
Lymphs Abs: 1.8 10*3/uL (ref 0.7–4.0)
MCH: 27.6 pg (ref 26.0–34.0)
MCHC: 32.6 g/dL (ref 30.0–36.0)
MCV: 84.6 fL (ref 80.0–100.0)
Monocytes Absolute: 0.3 10*3/uL (ref 0.1–1.0)
Monocytes Relative: 6 %
Neutro Abs: 3.5 10*3/uL (ref 1.7–7.7)
Neutrophils Relative %: 61 %
Platelets: 336 10*3/uL (ref 150–400)
RBC: 5 MIL/uL (ref 3.87–5.11)
RDW: 13.7 % (ref 11.5–15.5)
WBC: 5.8 10*3/uL (ref 4.0–10.5)
nRBC: 0 % (ref 0.0–0.2)

## 2021-04-03 LAB — URINALYSIS, COMPLETE (UACMP) WITH MICROSCOPIC
Bilirubin Urine: NEGATIVE
Glucose, UA: NEGATIVE mg/dL
Ketones, ur: NEGATIVE mg/dL
Nitrite: NEGATIVE
Protein, ur: NEGATIVE mg/dL
Specific Gravity, Urine: 1.02 (ref 1.005–1.030)
pH: 7 (ref 5.0–8.0)

## 2021-04-03 LAB — COMPREHENSIVE METABOLIC PANEL
ALT: 19 U/L (ref 0–44)
AST: 18 U/L (ref 15–41)
Albumin: 4.4 g/dL (ref 3.5–5.0)
Alkaline Phosphatase: 160 U/L — ABNORMAL HIGH (ref 38–126)
Anion gap: 6 (ref 5–15)
BUN: 7 mg/dL (ref 6–20)
CO2: 23 mmol/L (ref 22–32)
Calcium: 12.5 mg/dL — ABNORMAL HIGH (ref 8.9–10.3)
Chloride: 107 mmol/L (ref 98–111)
Creatinine, Ser: 0.52 mg/dL (ref 0.44–1.00)
GFR, Estimated: >60 mL/min (ref >60)
Glucose, Bld: 92 mg/dL (ref 70–99)
Potassium: 3.8 mmol/L (ref 3.5–5.1)
Sodium: 136 mmol/L (ref 135–145)
Total Bilirubin: 0.7 mg/dL (ref 0.3–1.2)
Total Protein: 7.9 g/dL (ref 6.5–8.1)

## 2021-04-03 LAB — HCG, QUANTITATIVE, PREGNANCY: hCG, Beta Chain, Quant, S: 48422 m[IU]/mL — ABNORMAL HIGH (ref <5)

## 2021-04-03 LAB — POC URINE PREG, ED: Preg Test, Ur: POSITIVE — AB

## 2021-04-03 LAB — ABO/RH: ABO/RH(D): O POS

## 2021-04-03 NOTE — Discharge Instructions (Addendum)
At this time things are reassuring but follow-up with your OB/GYN.  Return to the ER for worsening vaginal bleeding greater than 2 pads per hour, worsening pain or any other concern  IMPRESSION: 1. Single live intrauterine gestation measuring 6 weeks 5 days by crown-rump length. 2. Active embryonic heart tones at 135 beats per minute. 3. Small subchorionic hemorrhage.

## 2021-04-03 NOTE — ED Provider Notes (Signed)
[facility] Emergency Department Provider Note  ____________________________________________   Event Date/Time   First MD Initiated Contact with Patient 04/03/21 1049     (approximate)  I have reviewed the triage vital signs and the nursing notes.   HISTORY  Chief Complaint Vaginal Bleeding    HPI Kara House is a 20 y.o. female who reports having a positive pregnancy test on June 4.  Her last menstrual period was sometime in early May.  She reports a little bit of vaginal spotting for the past few days and today when she wiped there was quarter size amount of blood which had her worried.  This is her third pregnancy.  She has had 2 prior pregnancies with full-term births.  She denies any abdominal pain.  The bleeding is minimal, majority of onset was today, intermittent, nothing makes it better, nothing makes it worse.  She denies any shortness of breath, fatigue, lightheadedness or any other symptoms.  She is followed with Amedeo Plenty, OB, GYN.  Denies any vaginal discharge or concern for STDs.          History reviewed. No pertinent past medical history.  There are no problems to display for this patient.   Past Surgical History:  Procedure Laterality Date   TONSILLECTOMY      Prior to Admission medications   Medication Sig Start Date End Date Taking? Authorizing Provider  albuterol (VENTOLIN HFA) 108 (90 Base) MCG/ACT inhaler Inhale 2 puffs into the lungs every 6 (six) hours as needed for wheezing or shortness of breath. 12/21/20   Phineas Semen, MD  ketorolac (TORADOL) 10 MG tablet Take 1 tablet (10 mg total) by mouth every 8 (eight) hours as needed for severe pain. 12/21/20   Phineas Semen, MD  Prenatal Vit-Fe Fumarate-FA (PRENATAL VITAMIN PO) Take by mouth.    [provider]    Allergies Patient has no known allergies.  No family history on file.  Social History Social History   Tobacco Use   Smoking status:  Never   Smokeless tobacco: Never  Vaping Use   Vaping Use: Never used  Substance Use Topics   Alcohol use: Yes   Drug use: Never      Review of Systems Constitutional: No fever/chills Eyes: No visual changes. ENT: No sore throat. Cardiovascular: Denies chest pain. Respiratory: Denies shortness of breath. Gastrointestinal: No abdominal pain.  No nausea, no vomiting.  No diarrhea.  No constipation. Genitourinary: Negative for dysuria.  Vaginal bleeding Musculoskeletal: Negative for back pain. Skin: Negative for rash. Neurological: Negative for headaches, focal weakness or numbness. All other ROS negative ____________________________________________   PHYSICAL EXAM:  VITAL SIGNS: ED Triage Vitals  Enc Vitals Group     BP 04/03/21 1044 125/80     Pulse Rate 04/03/21 1044 90     Resp 04/03/21 1044 20     Temp 04/03/21 1044 98.4 F (36.9 C)     Temp Source 04/03/21 1044 Oral     SpO2 04/03/21 1044 99 %     Weight 04/03/21 1038 180 lb (81.6 kg)     Height 04/03/21 1038 5\' 3"  (1.6 m)     Head Circumference --      Peak Flow --      Pain Score 04/03/21 1038 0     Pain Loc --      Pain Edu? --      Excl. in GC? --     Constitutional: Alert and oriented. Well appearing and in  no acute distress. Eyes: Conjunctivae are normal. EOMI. Head: Atraumatic. Nose: No congestion/rhinnorhea. Mouth/Throat: Mucous membranes are moist.   Neck: No stridor. Trachea Midline. FROM Cardiovascular: Normal rate, regular rhythm. Grossly normal heart sounds.  Good peripheral circulation. Respiratory: Normal respiratory effort.  No retractions. Lungs CTAB. Gastrointestinal: Soft and nontender. No distention. No abdominal bruits.  Musculoskeletal: No lower extremity tenderness nor edema.  No joint effusions. Neurologic:  Normal speech and language. No gross focal neurologic deficits are appreciated.  Skin:  Skin is warm, dry and intact. No rash noted. Psychiatric: Mood and affect are normal.  Speech and behavior are normal. GU: Deferred   ____________________________________________   LABS (all labs ordered are listed, but only abnormal results are displayed)  Labs Reviewed  HCG, QUANTITATIVE, PREGNANCY  CBC WITH DIFFERENTIAL/PLATELET  COMPREHENSIVE METABOLIC PANEL  URINALYSIS, COMPLETE (UACMP) WITH MICROSCOPIC  POC URINE PREG, ED  ABO/RH   ____________________________________________   RADIOLOGY   Official radiology report(s): US OB LESS THAN 14 WEEKS WITH OB TRANSVAGINAL  Result Date: 04/03/2021 CLINICAL DATA:  Vaginal bleeding.  Positive beta HCG EXAM: OBSTETRIC <14 WK [place] AND TRANSVAGINAL OB US TECHNIQUE: Both transabdominal and transvaginal ultrasound examinations were performed for complete evaluation of the gestation as well as the maternal uterus, adnexal regions, and pelvic cul-de-sac. Transvaginal technique was performed to assess early pregnancy. COMPARISON:  None. FINDINGS: Intrauterine gestational sac: Single Yolk sac:  Visualized. Embryo:  Visualized. Cardiac Activity: Visualized. Heart Rate: 135 bpm CRL:  8.2 mm   6 w   5 d                  [place] EDC: 11/22/2020 Subchorionic hemorrhage: Small subchorionic hemorrhage measuring 7 x 5 x 6 mm. Maternal uterus/adnexae: Right ovary is unremarkable. Probable left ovarian corpus luteal cyst. Trace free fluid within the cul-de-sac. IMPRESSION: 1. Single live intrauterine gestation measuring 6 weeks 5 days by crown-rump length. 2. Active embryonic heart tones at 135 beats per minute. 3. Small subchorionic hemorrhage. Electronically Signed   By: Duanne Guess D.O.   On: 04/03/2021 13:24    ____________________________________________   PROCEDURES  Procedure(s) performed (including Critical Care):  Procedures   ____________________________________________   INITIAL IMPRESSION / ASSESSMENT AND PLAN / ED COURSE  [name] was evaluated in Emergency Department on 04/03/2021 for the symptoms described in the  history of present illness. She was evaluated in the context of the global COVID-19 pandemic, which necessitated consideration that the patient might be at risk for infection with the SARS-CoV-2 virus that causes COVID-19. Institutional protocols and algorithms that pertain to the evaluation of patients at risk for COVID-19 are in a state of rapid change based on information released by regulatory bodies including the CDC and federal and state organizations. These policies and algorithms were followed during the patient's care in the ED.    Patient is a well-appearing 20 year old who comes in for vaginal bleeding in the setting of probable pregnancy.  Will get labs to evaluate for pregnancy, ectopic, miscarriage, threatened miscarriage.  Will assess Rh status.  Patient is hemodynamically stable at this time we will continue to closely monitor  UA without signs of UTI- Rare bacteria but squamous cells so most likely contaminant- denies UTI symptoms- will send for culture to make sure no asymp. Bacter.   Labs re-assuring  US IMPRESSION: 1. Single live intrauterine gestation measuring 6 weeks 5 days by crown-rump length. 2. Active embryonic heart tones at 135 beats per minute. 3. Small subchorionic hemorrhage.  Does  not need rhogam  D/w pt possible threatened miscarriage and return precautions for this. Declines pelvic given no signifncat vag bleeding and denies concerns for STDS.  Patient will follow-up with OB/GYN        ____________________________________________   FINAL CLINICAL IMPRESSION(S) / ED DIAGNOSES   Final diagnoses:  Threatened miscarriage in early pregnancy      MEDICATIONS GIVEN DURING THIS VISIT:  Medications - No data to display   ED Discharge Orders     None        Note:  This document was prepared using Dragon voice recognition software and may include unintentional dictation errors.    Concha Se, MD 04/03/21 [phone]

## 2021-04-03 NOTE — ED Triage Notes (Signed)
Pt reports found out on June 4th that she was pregnant with a home pregnancy test and today she started bleeding vaginally. Pt states its was a circle of blood and a little when she wiped but no clots. Pt denies pain as well

## 2021-04-03 NOTE — ED Notes (Signed)
EDP at bedside. Pt CAOx4, in NAD. Pt LMP 02/22/21.

## 2021-04-04 ENCOUNTER — Telehealth: Payer: Self-pay | Admitting: *Deleted

## 2021-04-04 NOTE — Telephone Encounter (Signed)
Transition Care Management Unsuccessful Follow-up Telephone Call  Date of discharge and from where:  04/03/2021 [facility] ED  Attempts:  1st Attempt  Reason for unsuccessful TCM follow-up call:  Left voice message

## 2021-04-05 LAB — URINE CULTURE: Culture: <10000 — AB

## 2021-04-07 NOTE — Telephone Encounter (Signed)
Transition Care Management Follow-up Telephone Call Date of discharge and from where: 04/03/2021 [facility] ED How have you been since you were released from the hospital? "Doing fine" Any questions or concerns? No  Items Reviewed: Did the pt receive and understand the discharge instructions provided? Yes  Medications obtained and verified?  N/A Other? No  Any new allergies since your discharge? No  Dietary orders reviewed? No Do you have support at home? Yes    Functional Questionnaire: (I = Independent and D = Dependent) ADLs: I  Bathing/Dressing- I  Meal Prep- I  Eating- I  Maintaining continence- I  Transferring/Ambulation- I  Managing Meds- I  Follow up appointments reviewed:  PCP Hospital f/u appt confirmed? No   Specialist Hospital f/u appt confirmed? No   Are transportation arrangements needed? No  If their condition worsens, is the pt aware to call PCP or go to the Emergency Dept.? Yes Was the patient provided with contact information for the PCP's office or ED? Yes Was to pt encouraged to call back with questions or concerns? Yes

## 2021-04-17 DIAGNOSIS — Z3A08 8 weeks gestation of pregnancy: Secondary | ICD-10-CM | POA: Diagnosis not present

## 2021-04-17 DIAGNOSIS — O3680X0 Pregnancy with inconclusive fetal viability, not applicable or unspecified: Secondary | ICD-10-CM | POA: Diagnosis not present

## 2021-05-21 DIAGNOSIS — Z8759 Personal history of other complications of pregnancy, childbirth and the puerperium: Secondary | ICD-10-CM | POA: Insufficient documentation

## 2021-05-21 DIAGNOSIS — Z3482 Encounter for supervision of other normal pregnancy, second trimester: Secondary | ICD-10-CM | POA: Diagnosis not present

## 2021-06-04 DIAGNOSIS — Z3482 Encounter for supervision of other normal pregnancy, second trimester: Secondary | ICD-10-CM | POA: Diagnosis not present

## 2021-06-04 DIAGNOSIS — Z8759 Personal history of other complications of pregnancy, childbirth and the puerperium: Secondary | ICD-10-CM | POA: Diagnosis not present

## 2021-06-04 DIAGNOSIS — Z124 Encounter for screening for malignant neoplasm of cervix: Secondary | ICD-10-CM | POA: Diagnosis not present

## 2021-06-29 ENCOUNTER — Other Ambulatory Visit: Payer: Self-pay

## 2021-06-29 ENCOUNTER — Emergency Department: Payer: Medicaid Other

## 2021-06-29 ENCOUNTER — Emergency Department
Admission: EM | Admit: 2021-06-29 | Discharge: 2021-06-29 | Disposition: A | Discharge status: Home / Self Care | Payer: Medicaid Other | Attending: Emergency Medicine | Admitting: Emergency Medicine

## 2021-06-29 DIAGNOSIS — O2312 Infections of bladder in pregnancy, second trimester: Secondary | ICD-10-CM | POA: Diagnosis not present

## 2021-06-29 DIAGNOSIS — O99612 Diseases of the digestive system complicating pregnancy, second trimester: Secondary | ICD-10-CM | POA: Diagnosis not present

## 2021-06-29 DIAGNOSIS — N3000 Acute cystitis without hematuria: Secondary | ICD-10-CM

## 2021-06-29 DIAGNOSIS — R102 Pelvic and perineal pain: Secondary | ICD-10-CM | POA: Insufficient documentation

## 2021-06-29 DIAGNOSIS — Z3A19 19 weeks gestation of pregnancy: Secondary | ICD-10-CM | POA: Diagnosis not present

## 2021-06-29 DIAGNOSIS — R1032 Left lower quadrant pain: Secondary | ICD-10-CM | POA: Diagnosis present

## 2021-06-29 DIAGNOSIS — O26892 Other specified pregnancy related conditions, second trimester: Secondary | ICD-10-CM | POA: Diagnosis not present

## 2021-06-29 DIAGNOSIS — Z3492 Encounter for supervision of normal pregnancy, unspecified, second trimester: Secondary | ICD-10-CM

## 2021-06-29 LAB — CBC
HCT: 33.8 % — ABNORMAL LOW (ref 36.0–46.0)
Hemoglobin: 11.9 g/dL — ABNORMAL LOW (ref 12.0–15.0)
MCH: 30.2 pg (ref 26.0–34.0)
MCHC: 35.2 g/dL (ref 30.0–36.0)
MCV: 85.8 fL (ref 80.0–100.0)
Platelets: 265 10*3/uL (ref 150–400)
RBC: 3.94 MIL/uL (ref 3.87–5.11)
RDW: 14 % (ref 11.5–15.5)
WBC: 6.8 10*3/uL (ref 4.0–10.5)
nRBC: 0 % (ref 0.0–0.2)

## 2021-06-29 LAB — COMPREHENSIVE METABOLIC PANEL
ALT: 13 U/L (ref 0–44)
AST: 15 U/L (ref 15–41)
Albumin: 3.5 g/dL (ref 3.5–5.0)
Alkaline Phosphatase: 119 U/L (ref 38–126)
Anion gap: 4 — ABNORMAL LOW (ref 5–15)
BUN: 6 mg/dL (ref 6–20)
CO2: 24 mmol/L (ref 22–32)
Calcium: 11.8 mg/dL — ABNORMAL HIGH (ref 8.9–10.3)
Chloride: 106 mmol/L (ref 98–111)
Creatinine, Ser: 0.49 mg/dL (ref 0.44–1.00)
GFR, Estimated: >60 mL/min (ref >60)
Glucose, Bld: 81 mg/dL (ref 70–99)
Potassium: 3.8 mmol/L (ref 3.5–5.1)
Sodium: 134 mmol/L — ABNORMAL LOW (ref 135–145)
Total Bilirubin: 0.7 mg/dL (ref 0.3–1.2)
Total Protein: 6.5 g/dL (ref 6.5–8.1)

## 2021-06-29 LAB — URINALYSIS, COMPLETE (UACMP) WITH MICROSCOPIC
Bilirubin Urine: NEGATIVE
Glucose, UA: NEGATIVE mg/dL
Ketones, ur: 40 mg/dL — AB
Nitrite: NEGATIVE
Protein, ur: NEGATIVE mg/dL
Specific Gravity, Urine: 1.015 (ref 1.005–1.030)
pH: 7 (ref 5.0–8.0)

## 2021-06-29 LAB — HCG, QUANTITATIVE, PREGNANCY: hCG, Beta Chain, Quant, S: 26595 m[IU]/mL — ABNORMAL HIGH (ref <5)

## 2021-06-29 LAB — POC URINE PREG, ED: Preg Test, Ur: POSITIVE — AB

## 2021-06-29 LAB — LIPASE, BLOOD: Lipase: 22 U/L (ref 11–51)

## 2021-06-29 MED ORDER — CEPHALEXIN 500 MG PO CAPS: 500.0000 mg | ORAL_CAPSULE | Freq: Four times a day (QID) | ORAL | 0 refills | Status: AC

## 2021-06-29 MED ORDER — ACETAMINOPHEN 500 MG PO TABS
1000.0000 mg | ORAL_TABLET | Freq: Once | ORAL | Status: AC
  Administered 2021-06-29: 1000 mg via ORAL
  Filled 2021-06-29: qty 2

## 2021-06-29 NOTE — ED Provider Notes (Signed)
[facility] Emergency Department Provider Note  ____________________________________________   Event Date/Time   First MD Initiated Contact with Patient 06/29/21 1138     (approximate)  I have reviewed the triage vital signs and the nursing notes.   HISTORY  Chief Complaint Abdominal Pain   HPI Trenese Haft is a 20 y.o. female G3, P2 approximately [redacted] weeks pregnant without other significant past medical history receiving routine prenatal care for this pregnancy who presents for assessment of some left-sided abdominal pain that started this morning associate with urinary frequency.  She denies any nausea, vomiting, diarrhea, vaginal bleeding, discharge, burning with urination, right-sided abdominal pain, back pain, chest pain, cough, shortness of breath, rash or extremity pain.  No recent falls or injuries.  No other acute concerns at this time.         History reviewed. No pertinent past medical history.  There are no problems to display for this patient.   Past Surgical History:  Procedure Laterality Date   TONSILLECTOMY      Prior to Admission medications   Medication Sig Start Date End Date Taking? Authorizing Provider  cephALEXin (KEFLEX) 500 MG capsule Take 1 capsule (500 mg total) by mouth 4 (four) times daily for 7 days. 06/29/21 07/06/21 Yes Gilles Chiquito, MD  albuterol (VENTOLIN HFA) 108 (90 Base) MCG/ACT inhaler Inhale 2 puffs into the lungs every 6 (six) hours as needed for wheezing or shortness of breath. 12/21/20   Phineas Semen, MD  ketorolac (TORADOL) 10 MG tablet Take 1 tablet (10 mg total) by mouth every 8 (eight) hours as needed for severe pain. 12/21/20   Phineas Semen, MD  Prenatal Vit-Fe Fumarate-FA (PRENATAL VITAMIN PO) Take by mouth.    [provider]    Allergies Patient has no known allergies.  No family history on file.  Social History Social History   Tobacco Use   Smoking status: Never    Smokeless tobacco: Never  Vaping Use   Vaping Use: Never used  Substance Use Topics   Alcohol use: Yes   Drug use: Never    Review of Systems  Review of Systems  Constitutional:  Negative for chills and fever.  HENT:  Negative for sore throat.   Eyes:  Negative for pain.  Respiratory:  Negative for cough and stridor.   Cardiovascular:  Negative for chest pain.  Gastrointestinal:  Positive for abdominal pain. Negative for vomiting.  Genitourinary:  Positive for frequency.  Musculoskeletal:  Negative for myalgias.  Skin:  Negative for rash.  Neurological:  Negative for seizures, loss of consciousness and headaches.  Psychiatric/Behavioral:  Negative for suicidal ideas.   All other systems reviewed and are negative.    ____________________________________________   PHYSICAL EXAM:  VITAL SIGNS: ED Triage Vitals  Enc Vitals Group     BP 06/29/21 1046 134/90     Pulse Rate 06/29/21 1046 96     Resp 06/29/21 1046 20     Temp 06/29/21 1046 98.4 F (36.9 C)     Temp Source 06/29/21 1046 Oral     SpO2 06/29/21 1046 98 %     Weight 06/29/21 1047 205 lb (93 kg)     Height 06/29/21 1047 5\' 2"  (1.575 m)     Head Circumference --      Peak Flow --      Pain Score 06/29/21 1047 10     Pain Loc --      Pain Edu? --  Excl. in GC? --    Vitals:   06/29/21 1046 06/29/21 1148  BP: 134/90   Pulse: 96   Resp: 20   Temp: 98.4 F (36.9 C)   SpO2: 98% 99%   Physical Exam Vitals and nursing note reviewed.  Constitutional:      General: She is not in acute distress.    Appearance: She is well-developed.  HENT:     Head: Normocephalic and atraumatic.     Right Ear: External ear normal.     Left Ear: External ear normal.     Nose: Nose normal.     Mouth/Throat:     Mouth: Mucous membranes are moist.  Eyes:     Conjunctiva/sclera: Conjunctivae normal.  Cardiovascular:     Rate and Rhythm: Normal rate and regular rhythm.     Heart sounds: No murmur heard. Pulmonary:      Effort: Pulmonary effort is normal. No respiratory distress.     Breath sounds: Normal breath sounds.  Abdominal:     Palpations: Abdomen is soft.     Tenderness: There is abdominal tenderness in the left lower quadrant. There is no right CVA tenderness or left CVA tenderness.  Musculoskeletal:     Cervical back: Neck supple.  Skin:    General: Skin is warm and dry.     Capillary Refill: Capillary refill takes less than 2 seconds.  Neurological:     Mental Status: She is alert and oriented to person, place, and time.  Psychiatric:        Mood and Affect: Mood normal.     ____________________________________________   LABS (all labs ordered are listed, but only abnormal results are displayed)  Labs Reviewed  COMPREHENSIVE METABOLIC PANEL - Abnormal; Notable for the following components:      Result Value   Sodium 134 (*)    Calcium 11.8 (*)    Anion gap 4 (*)    All other components within normal limits  CBC - Abnormal; Notable for the following components:   Hemoglobin 11.9 (*)    HCT 33.8 (*)    All other components within normal limits  URINALYSIS, COMPLETE (UACMP) WITH MICROSCOPIC - Abnormal; Notable for the following components:   Hgb urine dipstick SMALL (*)    Ketones, ur 40 (*)    Leukocytes,Ua SMALL (*)    Bacteria, UA RARE (*)    All other components within normal limits  HCG, QUANTITATIVE, PREGNANCY - Abnormal; Notable for the following components:   hCG, Beta Chain, Quant, S 26,595 (*)    All other components within normal limits  POC URINE PREG, ED - Abnormal; Notable for the following components:   Preg Test, Ur POSITIVE (*)    All other components within normal limits  URINE CULTURE  LIPASE, BLOOD   ____________________________________________  EKG  ____________________________________________  RADIOLOGY  ED MD interpretation: OB and pelvic ultrasound ordered for single live IUP in breech position at approximately 19 weeks 1 day.  No acute  abnormalities noted.  There is no evidence of torsion and ovaries are unremarkable.  No adnexal mass or free fluid.   Official radiology report(s): US OB Limited  Result Date: 06/29/2021 CLINICAL DATA:  20 year old pregnant female with acute LEFT pelvic pain for 1 day. Assigned gestational age of [redacted] weeks 1 day. EXAM: LIMITED OBSTETRIC ULTRASOUND AND DOPPLER US OF OVARIES FINDINGS: Number of Fetuses: 1 Heart Rate:  132 bpm Movement: Yes Presentation: Breech Placental Location: Posterior Previa: No Amniotic Fluid (Subjective):  Within normal limits. AFI: 5.6 cm BPD:  4.8cm 20w 3d MATERNAL FINDINGS: Cervix:  Appears closed.  5 cm length. Uterus/Adnexae: No abnormality visualized. Pulsed Doppler evaluation of both ovaries demonstrates normal low-resistance arterial and venous waveforms. Other findings No adnexal mass or free fluid. IMPRESSION: Single living intrauterine gestation with assigned gestational age of [redacted] weeks 1 day, in breech presentation. Unremarkable ovaries.  No evidence of ovarian torsion. No adnexal mass or free fluid. This exam is performed on an emergent basis and does not comprehensively evaluate fetal size, dating, or anatomy; follow-up complete OB US should be considered if further fetal assessment is warranted. Electronically Signed   By: Harmon Pier M.D.   On: 06/29/2021 13:17   US ABDOMINAL PELVIC ART/VENT FLOW DOPPLER LIMITED  Result Date: 06/29/2021 CLINICAL DATA:  20 year old pregnant female with acute LEFT pelvic pain for 1 day. Assigned gestational age of [redacted] weeks 1 day. EXAM: LIMITED OBSTETRIC ULTRASOUND AND DOPPLER US OF OVARIES FINDINGS: Number of Fetuses: 1 Heart Rate:  132 bpm Movement: Yes Presentation: Breech Placental Location: Posterior Previa: No Amniotic Fluid (Subjective):  Within normal limits. AFI: 5.6 cm BPD:  4.8cm 20w 3d MATERNAL FINDINGS: Cervix:  Appears closed.  5 cm length. Uterus/Adnexae: No abnormality visualized. Pulsed Doppler evaluation of both ovaries  demonstrates normal low-resistance arterial and venous waveforms. Other findings No adnexal mass or free fluid. IMPRESSION: Single living intrauterine gestation with assigned gestational age of [redacted] weeks 1 day, in breech presentation. Unremarkable ovaries.  No evidence of ovarian torsion. No adnexal mass or free fluid. This exam is performed on an emergent basis and does not comprehensively evaluate fetal size, dating, or anatomy; follow-up complete OB US should be considered if further fetal assessment is warranted. Electronically Signed   By: Harmon Pier M.D.   On: 06/29/2021 13:17    ____________________________________________   PROCEDURES  Procedure(s) performed (including Critical Care):  Procedures   ____________________________________________   INITIAL IMPRESSION / ASSESSMENT AND PLAN / ED COURSE        Patient presents with above-stated history and exam for assessment of some left lower quadrant abdominal pain associate with increased urinary frequency that started this morning.  On arrival she is afebrile hemodynamically stable.  This in the setting of being [redacted] weeks pregnant with [place] for uncomplicated pregnancy.  She has mild tenderness in the left lower quadrant but otherwise unremarkable exam.  Differential includes cystitis, torsion, round ligament pain, and hyperglycemia and metabolic derangements.  CMP shows no significant electrolyte or metabolic derangements.  No tenderness in the right upper quadrant to suggest cholecystitis.  Lipase not consistent with pancreatitis.  hCG is 26,595.  UA remarkable for small excite esterase and some bacteria with 6-10 WBCs.  OB and pelvic ultrasound ordered for single live IUP in breech position at approximately 19 weeks 1 day.  No acute abnormalities noted.  There is no evidence of torsion and ovaries are unremarkable.  No adnexal mass or free fluid.   Given some leukocyte esterase, bacteriuria and history of increased urinary frequency  with some mild lower quadrant pain with otherwise reassuring labs ultrasound and concern for cystitis.  Will treat with a course of Keflex.  Patient states he is feeling better after some Tylenol.  Advised her to keep her appointment with OB she has placed this week.  Discharged stable condition.  Strict return precautions advised discussed.  Also advised of findings ultrasound of breech position and importance of discussing this with her OB.   ____________________________________________  FINAL CLINICAL IMPRESSION(S) / ED DIAGNOSES  Final diagnoses:  Pelvic pain  Acute cystitis without hematuria  Second trimester pregnancy    Medications  acetaminophen (TYLENOL) tablet 1,000 mg (1,000 mg Oral Given 06/29/21 1204)     ED Discharge Orders          Ordered    cephALEXin (KEFLEX) 500 MG capsule  4 times daily        06/29/21 1331             Note:  This document was prepared using Dragon voice recognition software and may include unintentional dictation errors.    Gilles Chiquito, MD 06/29/21 [phone]

## 2021-06-29 NOTE — Discharge Instructions (Addendum)
Your Ultrasound today showed Single living intrauterine gestation with assigned gestational age of [redacted] weeks 1 day, in breech presentation.   Unremarkable ovaries.  No evidence of ovarian torsion.   No adnexal mass or free fluid.

## 2021-06-29 NOTE — ED Triage Notes (Signed)
Pt states that she is having pain on L sided abd- pt states that she is 19weeks preg- pt states she is also having urinary frequency- denies burning with urination- pt denies n/v/d- pian started around 0930

## 2021-06-30 ENCOUNTER — Telehealth: Payer: Self-pay

## 2021-06-30 LAB — URINE CULTURE: Culture: <10000 — AB

## 2021-06-30 NOTE — Telephone Encounter (Signed)
Transition Care Management Follow-up Telephone Call Date of discharge and from where: 06/29/2021-[facility] How have you been since you were released from the hospital? Patient stated she is doing fine.  Any questions or concerns? No  Items Reviewed: Did the pt receive and understand the discharge instructions provided? Yes  Medications obtained and verified? Yes  Other? No  Any new allergies since your discharge? No  Dietary orders reviewed? N/A Do you have support at home? Yes   Home Care and Equipment/Supplies: Were home health services ordered? not applicable If so, what is the name of the agency? N/A  Has the agency set up a time to come to the patient's home? not applicable Were any new equipment or medical supplies ordered?  No What is the name of the medical supply agency? N/A Were you able to get the supplies/equipment? not applicable Do you have any questions related to the use of the equipment or supplies? No  Functional Questionnaire: (I = Independent and D = Dependent) ADLs: I  Bathing/Dressing- I  Meal Prep- I  Eating- I  Maintaining continence- I  Transferring/Ambulation- I  Managing Meds- I  Follow up appointments reviewed:  PCP Hospital f/u appt confirmed? No   Specialist Hospital f/u appt confirmed? Yes  Scheduled to see Dr. Kerrin Mo on 07/02/2021 @ 11:30 am. Are transportation arrangements needed? No  If their condition worsens, is the pt aware to call PCP or go to the Emergency Dept.? Yes Was the patient provided with contact information for the PCP's office or ED? Yes Was to pt encouraged to call back with questions or concerns? Yes

## 2021-07-02 DIAGNOSIS — E669 Obesity, unspecified: Secondary | ICD-10-CM | POA: Diagnosis not present

## 2021-07-15 DIAGNOSIS — Z3A21 21 weeks gestation of pregnancy: Secondary | ICD-10-CM | POA: Diagnosis not present

## 2021-07-15 DIAGNOSIS — Z3689 Encounter for other specified antenatal screening: Secondary | ICD-10-CM | POA: Diagnosis not present

## 2021-08-12 DIAGNOSIS — Z23 Encounter for immunization: Secondary | ICD-10-CM | POA: Diagnosis not present

## 2021-08-26 DIAGNOSIS — O09299 Supervision of pregnancy with other poor reproductive or obstetric history, unspecified trimester: Secondary | ICD-10-CM | POA: Diagnosis not present

## 2021-08-26 DIAGNOSIS — Z3A27 27 weeks gestation of pregnancy: Secondary | ICD-10-CM | POA: Diagnosis not present

## 2021-08-26 DIAGNOSIS — O3662X0 Maternal care for excessive fetal growth, second trimester, not applicable or unspecified: Secondary | ICD-10-CM | POA: Diagnosis not present

## 2021-08-26 DIAGNOSIS — Z3482 Encounter for supervision of other normal pregnancy, second trimester: Secondary | ICD-10-CM | POA: Diagnosis not present

## 2021-09-10 DIAGNOSIS — Z23 Encounter for immunization: Secondary | ICD-10-CM | POA: Diagnosis not present

## 2021-09-30 DIAGNOSIS — L299 Pruritus, unspecified: Secondary | ICD-10-CM | POA: Diagnosis not present

## 2021-09-30 DIAGNOSIS — R0602 Shortness of breath: Secondary | ICD-10-CM | POA: Diagnosis not present

## 2021-09-30 DIAGNOSIS — I10 Essential (primary) hypertension: Secondary | ICD-10-CM | POA: Diagnosis not present

## 2021-09-30 DIAGNOSIS — R002 Palpitations: Secondary | ICD-10-CM | POA: Diagnosis not present

## 2021-10-02 DIAGNOSIS — R748 Abnormal levels of other serum enzymes: Secondary | ICD-10-CM | POA: Insufficient documentation

## 2021-10-07 DIAGNOSIS — K831 Obstruction of bile duct: Secondary | ICD-10-CM | POA: Diagnosis not present

## 2021-10-07 DIAGNOSIS — R8290 Unspecified abnormal findings in urine: Secondary | ICD-10-CM | POA: Diagnosis not present

## 2021-10-07 DIAGNOSIS — Z3A33 33 weeks gestation of pregnancy: Secondary | ICD-10-CM | POA: Diagnosis not present

## 2021-10-07 DIAGNOSIS — O26613 Liver and biliary tract disorders in pregnancy, third trimester: Secondary | ICD-10-CM | POA: Diagnosis not present

## 2021-10-09 DIAGNOSIS — Z8759 Personal history of other complications of pregnancy, childbirth and the puerperium: Secondary | ICD-10-CM | POA: Diagnosis not present

## 2021-10-09 DIAGNOSIS — K831 Obstruction of bile duct: Secondary | ICD-10-CM | POA: Diagnosis not present

## 2021-10-09 DIAGNOSIS — O26613 Liver and biliary tract disorders in pregnancy, third trimester: Secondary | ICD-10-CM | POA: Diagnosis not present

## 2021-10-10 DIAGNOSIS — O26613 Liver and biliary tract disorders in pregnancy, third trimester: Secondary | ICD-10-CM | POA: Diagnosis not present

## 2021-10-10 DIAGNOSIS — Z3A31 31 weeks gestation of pregnancy: Secondary | ICD-10-CM | POA: Diagnosis not present

## 2021-10-10 DIAGNOSIS — K831 Obstruction of bile duct: Secondary | ICD-10-CM | POA: Diagnosis not present

## 2021-10-10 DIAGNOSIS — O09299 Supervision of pregnancy with other poor reproductive or obstetric history, unspecified trimester: Secondary | ICD-10-CM | POA: Diagnosis not present

## 2021-10-14 DIAGNOSIS — O0993 Supervision of high risk pregnancy, unspecified, third trimester: Secondary | ICD-10-CM | POA: Diagnosis not present

## 2021-10-14 DIAGNOSIS — O99283 Endocrine, nutritional and metabolic diseases complicating pregnancy, third trimester: Secondary | ICD-10-CM | POA: Diagnosis not present

## 2021-10-14 DIAGNOSIS — K831 Obstruction of bile duct: Secondary | ICD-10-CM | POA: Diagnosis not present

## 2021-10-14 DIAGNOSIS — O09293 Supervision of pregnancy with other poor reproductive or obstetric history, third trimester: Secondary | ICD-10-CM | POA: Diagnosis not present

## 2021-10-14 DIAGNOSIS — O26613 Liver and biliary tract disorders in pregnancy, third trimester: Secondary | ICD-10-CM | POA: Diagnosis not present

## 2021-10-21 DIAGNOSIS — O99283 Endocrine, nutritional and metabolic diseases complicating pregnancy, third trimester: Secondary | ICD-10-CM | POA: Diagnosis not present

## 2021-10-21 DIAGNOSIS — O26613 Liver and biliary tract disorders in pregnancy, third trimester: Secondary | ICD-10-CM | POA: Diagnosis not present

## 2021-10-21 DIAGNOSIS — E210 Primary hyperparathyroidism: Secondary | ICD-10-CM | POA: Diagnosis not present

## 2021-10-21 DIAGNOSIS — E213 Hyperparathyroidism, unspecified: Secondary | ICD-10-CM | POA: Diagnosis not present

## 2021-10-21 DIAGNOSIS — Z3A35 35 weeks gestation of pregnancy: Secondary | ICD-10-CM | POA: Diagnosis not present

## 2021-10-21 DIAGNOSIS — O09293 Supervision of pregnancy with other poor reproductive or obstetric history, third trimester: Secondary | ICD-10-CM | POA: Diagnosis not present

## 2021-10-22 DIAGNOSIS — E210 Primary hyperparathyroidism: Secondary | ICD-10-CM | POA: Insufficient documentation

## 2021-10-22 DIAGNOSIS — E213 Hyperparathyroidism, unspecified: Secondary | ICD-10-CM | POA: Diagnosis not present

## 2021-10-22 DIAGNOSIS — O3663X0 Maternal care for excessive fetal growth, third trimester, not applicable or unspecified: Secondary | ICD-10-CM | POA: Diagnosis not present

## 2021-10-22 DIAGNOSIS — Z3A35 35 weeks gestation of pregnancy: Secondary | ICD-10-CM | POA: Diagnosis not present

## 2021-10-28 DIAGNOSIS — R7989 Other specified abnormal findings of blood chemistry: Secondary | ICD-10-CM | POA: Diagnosis not present

## 2021-10-28 DIAGNOSIS — E213 Hyperparathyroidism, unspecified: Secondary | ICD-10-CM | POA: Diagnosis not present

## 2021-10-28 DIAGNOSIS — Z3A36 36 weeks gestation of pregnancy: Secondary | ICD-10-CM | POA: Diagnosis not present

## 2021-10-29 DIAGNOSIS — K831 Obstruction of bile duct: Secondary | ICD-10-CM | POA: Diagnosis not present

## 2021-10-29 DIAGNOSIS — O26613 Liver and biliary tract disorders in pregnancy, third trimester: Secondary | ICD-10-CM | POA: Diagnosis not present

## 2021-10-29 DIAGNOSIS — Z6839 Body mass index (BMI) 39.0-39.9, adult: Secondary | ICD-10-CM | POA: Diagnosis not present

## 2021-10-29 DIAGNOSIS — O99283 Endocrine, nutritional and metabolic diseases complicating pregnancy, third trimester: Secondary | ICD-10-CM | POA: Diagnosis not present

## 2021-10-29 DIAGNOSIS — O0993 Supervision of high risk pregnancy, unspecified, third trimester: Secondary | ICD-10-CM | POA: Diagnosis not present

## 2021-10-29 DIAGNOSIS — E213 Hyperparathyroidism, unspecified: Secondary | ICD-10-CM | POA: Diagnosis not present

## 2021-10-29 DIAGNOSIS — E210 Primary hyperparathyroidism: Secondary | ICD-10-CM | POA: Diagnosis not present

## 2021-10-29 DIAGNOSIS — Z3A36 36 weeks gestation of pregnancy: Secondary | ICD-10-CM | POA: Diagnosis not present

## 2021-10-29 DIAGNOSIS — R7989 Other specified abnormal findings of blood chemistry: Secondary | ICD-10-CM | POA: Diagnosis not present

## 2021-10-30 DIAGNOSIS — R7989 Other specified abnormal findings of blood chemistry: Secondary | ICD-10-CM | POA: Diagnosis not present

## 2021-10-30 DIAGNOSIS — Z3A36 36 weeks gestation of pregnancy: Secondary | ICD-10-CM | POA: Diagnosis not present

## 2021-10-30 DIAGNOSIS — O26613 Liver and biliary tract disorders in pregnancy, third trimester: Secondary | ICD-10-CM | POA: Diagnosis not present

## 2021-10-30 DIAGNOSIS — K831 Obstruction of bile duct: Secondary | ICD-10-CM | POA: Diagnosis not present

## 2021-10-30 DIAGNOSIS — E210 Primary hyperparathyroidism: Secondary | ICD-10-CM | POA: Diagnosis not present

## 2021-10-30 DIAGNOSIS — Z6839 Body mass index (BMI) 39.0-39.9, adult: Secondary | ICD-10-CM | POA: Diagnosis not present

## 2021-10-30 DIAGNOSIS — O99283 Endocrine, nutritional and metabolic diseases complicating pregnancy, third trimester: Secondary | ICD-10-CM | POA: Diagnosis not present

## 2021-10-30 DIAGNOSIS — O0993 Supervision of high risk pregnancy, unspecified, third trimester: Secondary | ICD-10-CM | POA: Diagnosis not present

## 2021-10-30 DIAGNOSIS — E213 Hyperparathyroidism, unspecified: Secondary | ICD-10-CM | POA: Diagnosis not present

## 2021-10-31 DIAGNOSIS — O0993 Supervision of high risk pregnancy, unspecified, third trimester: Secondary | ICD-10-CM | POA: Diagnosis not present

## 2021-10-31 DIAGNOSIS — R7989 Other specified abnormal findings of blood chemistry: Secondary | ICD-10-CM | POA: Diagnosis not present

## 2021-10-31 DIAGNOSIS — Z6839 Body mass index (BMI) 39.0-39.9, adult: Secondary | ICD-10-CM | POA: Diagnosis not present

## 2021-10-31 DIAGNOSIS — O26613 Liver and biliary tract disorders in pregnancy, third trimester: Secondary | ICD-10-CM | POA: Diagnosis not present

## 2021-10-31 DIAGNOSIS — Z3A36 36 weeks gestation of pregnancy: Secondary | ICD-10-CM | POA: Diagnosis not present

## 2021-10-31 DIAGNOSIS — E210 Primary hyperparathyroidism: Secondary | ICD-10-CM | POA: Diagnosis not present

## 2021-10-31 DIAGNOSIS — O99283 Endocrine, nutritional and metabolic diseases complicating pregnancy, third trimester: Secondary | ICD-10-CM | POA: Diagnosis not present

## 2021-10-31 DIAGNOSIS — E213 Hyperparathyroidism, unspecified: Secondary | ICD-10-CM | POA: Diagnosis not present

## 2021-10-31 DIAGNOSIS — K831 Obstruction of bile duct: Secondary | ICD-10-CM | POA: Diagnosis not present

## 2021-11-01 DIAGNOSIS — E210 Primary hyperparathyroidism: Secondary | ICD-10-CM | POA: Diagnosis not present

## 2021-11-01 DIAGNOSIS — Z3A36 36 weeks gestation of pregnancy: Secondary | ICD-10-CM | POA: Diagnosis not present

## 2021-11-01 DIAGNOSIS — O99284 Endocrine, nutritional and metabolic diseases complicating childbirth: Secondary | ICD-10-CM | POA: Diagnosis not present

## 2021-11-01 DIAGNOSIS — E213 Hyperparathyroidism, unspecified: Secondary | ICD-10-CM | POA: Diagnosis not present

## 2021-11-01 DIAGNOSIS — R7989 Other specified abnormal findings of blood chemistry: Secondary | ICD-10-CM | POA: Diagnosis not present

## 2021-11-01 DIAGNOSIS — Z3A37 37 weeks gestation of pregnancy: Secondary | ICD-10-CM | POA: Diagnosis not present

## 2021-11-02 DIAGNOSIS — Z3A36 36 weeks gestation of pregnancy: Secondary | ICD-10-CM | POA: Diagnosis not present

## 2021-11-02 DIAGNOSIS — E213 Hyperparathyroidism, unspecified: Secondary | ICD-10-CM | POA: Diagnosis not present

## 2021-11-02 DIAGNOSIS — O99284 Endocrine, nutritional and metabolic diseases complicating childbirth: Secondary | ICD-10-CM | POA: Diagnosis not present

## 2021-11-02 DIAGNOSIS — R7989 Other specified abnormal findings of blood chemistry: Secondary | ICD-10-CM | POA: Diagnosis not present

## 2021-11-03 DIAGNOSIS — E213 Hyperparathyroidism, unspecified: Secondary | ICD-10-CM | POA: Diagnosis not present

## 2021-11-03 DIAGNOSIS — E041 Nontoxic single thyroid nodule: Secondary | ICD-10-CM | POA: Diagnosis not present

## 2021-11-03 DIAGNOSIS — E210 Primary hyperparathyroidism: Secondary | ICD-10-CM | POA: Diagnosis not present

## 2021-11-04 DIAGNOSIS — Z6839 Body mass index (BMI) 39.0-39.9, adult: Secondary | ICD-10-CM | POA: Diagnosis not present

## 2021-11-04 DIAGNOSIS — E210 Primary hyperparathyroidism: Secondary | ICD-10-CM | POA: Diagnosis not present

## 2021-11-04 DIAGNOSIS — Z3A36 36 weeks gestation of pregnancy: Secondary | ICD-10-CM | POA: Diagnosis not present

## 2021-11-04 DIAGNOSIS — O0993 Supervision of high risk pregnancy, unspecified, third trimester: Secondary | ICD-10-CM | POA: Diagnosis not present

## 2021-11-11 DIAGNOSIS — E210 Primary hyperparathyroidism: Secondary | ICD-10-CM | POA: Diagnosis not present

## 2021-11-12 HISTORY — PX: PARATHYROIDECTOMY: SHX19

## 2021-11-21 DIAGNOSIS — Z01818 Encounter for other preprocedural examination: Secondary | ICD-10-CM | POA: Diagnosis not present

## 2021-11-21 DIAGNOSIS — Z20822 Contact with and (suspected) exposure to covid-19: Secondary | ICD-10-CM | POA: Diagnosis not present

## 2021-11-24 DIAGNOSIS — D351 Benign neoplasm of parathyroid gland: Secondary | ICD-10-CM | POA: Diagnosis not present

## 2021-11-24 DIAGNOSIS — E210 Primary hyperparathyroidism: Secondary | ICD-10-CM | POA: Diagnosis not present

## 2021-12-09 DIAGNOSIS — E210 Primary hyperparathyroidism: Secondary | ICD-10-CM | POA: Diagnosis not present

## 2021-12-09 DIAGNOSIS — E064 Drug-induced thyroiditis: Secondary | ICD-10-CM | POA: Diagnosis not present

## 2021-12-18 DIAGNOSIS — Z3009 Encounter for other general counseling and advice on contraception: Secondary | ICD-10-CM | POA: Diagnosis not present

## 2022-01-05 DIAGNOSIS — Z30430 Encounter for insertion of intrauterine contraceptive device: Secondary | ICD-10-CM | POA: Diagnosis not present

## 2022-02-26 DIAGNOSIS — Z975 Presence of (intrauterine) contraceptive device: Secondary | ICD-10-CM | POA: Diagnosis not present

## 2022-07-07 ENCOUNTER — Ambulatory Visit (INDEPENDENT_AMBULATORY_CARE_PROVIDER_SITE_OTHER): Payer: Medicaid Other | Admitting: Family Medicine

## 2022-07-07 ENCOUNTER — Encounter: Payer: Self-pay | Admitting: Family Medicine

## 2022-07-07 VITALS — BP 117/78 | HR 82 | Ht 63.0 in | Wt 234.0 lb

## 2022-07-07 DIAGNOSIS — L732 Hidradenitis suppurativa: Secondary | ICD-10-CM

## 2022-07-07 DIAGNOSIS — K2100 Gastro-esophageal reflux disease with esophagitis, without bleeding: Secondary | ICD-10-CM

## 2022-07-07 MED ORDER — PANTOPRAZOLE SODIUM 40 MG PO TBEC: 40.0000 mg | DELAYED_RELEASE_TABLET | Freq: Every day | ORAL | 0 refills | Status: DC

## 2022-07-07 MED ORDER — CEPHALEXIN 500 MG PO CAPS: 500.0000 mg | ORAL_CAPSULE | Freq: Two times a day (BID) | ORAL | 0 refills | Status: DC

## 2022-07-07 NOTE — Progress Notes (Signed)
     Primary Care / Sports Medicine Office Visit  Patient Information:  Patient ID: Kara House, female DOB: 04-16-2001 Age: 21 y.o. MRN: [id number]   Ashawnti Tangen is a pleasant 21 y.o. female presenting with the following:  Chief Complaint  Patient presents with   Establish Care   Gastroesophageal Reflux   bumps    Arm pits, arms and legs for 2 months, they are painful.     Vitals:   07/07/22 1522  BP: 117/78  Pulse: 82  SpO2: 98%   Vitals:   07/07/22 1522  Weight: 234 lb (106.1 kg)  Height: 5\' 3"  (1.6 m)   Body mass index is 41.45 kg/m.  No results found.   Independent interpretation of notes and tests performed by another provider:   None  Procedures performed:   None  Pertinent History, Exam, Impression, and Recommendations:   Problem List Items Addressed This Visit       Digestive   Gastroesophageal reflux disease with esophagitis without hemorrhage - Primary    Patient with chronic history of GERD, recent exacerbation over the past few months without specific trigger, does note correlation with spicy foods and worsening symptoms.  Symptoms awaken her in the middle of the night, epigastric in location without radiation, does not respond to Tylenol, no treatments other than try and avoid spicy foods.  Moves about 3-4 times per day, does not feel complete emptying of bowels afterwards.  Denies nausea, emesis.  Abdominal examination reveals hyperactive bowel sounds, otherwise soft, nontender, no hepatosplenomegaly noted, negative Murphy's.  Clinical history raises concern for GERD, cannot exclude element of ulcer, advised pantoprazole scheduled until follow-up in 1 month in addition to patient oriented education regarding dietary modifications.      Relevant Medications   pantoprazole (PROTONIX) 40 MG tablet     Musculoskeletal and Integument   Hidradenitis suppurativa    66-month history of axillary and inguinal painful bumps, denies any new exposures  or change in products.  Examination shows mildly erythematous raised lesions bilateral axillary region, somewhat firm without induration, no clear demarcation of erythema noted surrounding this area.  Isolated erythematous papule on the left forearm and left face.  Her clinical history and findings of the axillae raise concern for hidradenitis suppurativa, additional involvement at arm and face most likely represent separate findings.  I have advised a 7-day course of Keflex as well as referral to dermatology for recalcitrant symptoms.  Lifestyle modifications addressed as well.      Relevant Medications   cephALEXin (KEFLEX) 500 MG capsule   Other Relevant Orders   Ambulatory referral to Dermatology     Orders & Medications Meds ordered this encounter  Medications   cephALEXin (KEFLEX) 500 MG capsule    Sig: Take 1 capsule (500 mg total) by mouth 2 (two) times daily.    Dispense:  14 capsule    Refill:  0   pantoprazole (PROTONIX) 40 MG tablet    Sig: Take 1 tablet (40 mg total) by mouth daily. 30+ minutes prior to dinner on empty stomach.    Dispense:  30 tablet    Refill:  0   Orders Placed This Encounter  Procedures   Ambulatory referral to Dermatology     Return in about 4 weeks (around 08/04/2022) for Annual physical.     Montel Culver, MD   [place]

## 2022-07-07 NOTE — Assessment & Plan Note (Addendum)
Patient with chronic history of GERD, recent exacerbation over the past few months without specific trigger, does note correlation with spicy foods and worsening symptoms.  Symptoms awaken her in the middle of the night, epigastric in location without radiation, does not respond to Tylenol, no treatments other than try and avoid spicy foods.  Moves about 3-4 times per day, does not feel complete emptying of bowels afterwards.  Denies nausea, emesis.  Abdominal examination reveals hyperactive bowel sounds, otherwise soft, nontender, no hepatosplenomegaly noted, negative Murphy's.  Clinical history raises concern for GERD, cannot exclude element of ulcer, advised pantoprazole scheduled until follow-up in 1 month in addition to patient oriented education regarding dietary modifications.

## 2022-07-07 NOTE — Assessment & Plan Note (Signed)
85-month history of axillary and inguinal painful bumps, denies any new exposures or change in products.  Examination shows mildly erythematous raised lesions bilateral axillary region, somewhat firm without induration, no clear demarcation of erythema noted surrounding this area.  Isolated erythematous papule on the left forearm and left face.  Her clinical history and findings of the axillae raise concern for hidradenitis suppurativa, additional involvement at arm and face most likely represent separate findings.  I have advised a 7-day course of Keflex as well as referral to dermatology for recalcitrant symptoms.  Lifestyle modifications addressed as well.

## 2022-07-07 NOTE — Patient Instructions (Addendum)
-   Start pantoprazole (stomach acid medication) at least 30 minutes prior to dinner on an empty stomach, take consistently until follow-up - Take Keflex (antibiotic) twice daily x7 days - Review information on sleep hygiene below - Return for physical in 1 month  Sleep hygiene advice  When possible, maximize regularity in activity and sleep schedule   Regularity in the timing of sleep, food intake, and social activity helps to stabilize the biological clock.Minimizing discrepancies in sleep timing between on-shift and off-shift periods may help you adapt to a fixed-shift schedule and may also help you adapt to each shift type in a rotating-shift schedule (depending onrotation speed and direction).   Create a sleep-friendly bedroom environment   Make sure that your bed is comfortable and that your bedroom is dark, quiet, and cool (around 14F or 18C).Blackout shades may be particularly important to block sunlight during daytime sleep. Creating constantbackground noise in the sleep environment with a fan or humidifier, for example, will eliminate unexpectedsounds that would otherwise wake you up.   Limit exposure to bright light before daytime sleep   Exposure to bright light (eg, sunlight during the morning commute home following a night shift) can bealerting and may also set your biological clock to a time that interferes with daytime sleep.   Make the last hour before bed a "wind-down" time   Engage in relaxing and pleasant activities, dim or block light in the room, and have a light snack.   Do not use alcohol to help you sleep and do not consume alcohol too close to bedtime   Although alcohol may help you to fall asleep more easily, it disrupts your sleep during the night by causingfrequent awakenings. One drink of alcohol should not be consumed within three hours of bedtime.   Smoking and other drugs will disrupt your sleep   If you smoke, do not smoke too close to bedtime or if you  wake up during the intended sleep period. Mostdrugs of abuse can disrupt sleep.   Avoid caffeinated products within six hours of bedtime   In addition to coffee, these may include tea, chocolate, and many sodas.   Exercise regularly, but avoid activities that raise body temperature close to bedtime   Regular exercise can improve sleep quality, but exercising or having a warm bath too close to bedtime candisrupt your ability to fall asleep. Warm baths should be avoided within 1.5 hours of bedtime.   Avoid consuming more than 8 to 10 ounces of liquids close to bedtime   A full or semi-full bladder can contribute to awakenings. Restrict liquids close to bedtime and empty yourbladder just before going to bed.

## 2022-08-06 ENCOUNTER — Encounter: Payer: Medicaid Other | Admitting: Family Medicine

## 2022-08-20 ENCOUNTER — Encounter: Payer: Medicaid Other | Admitting: Family Medicine

## 2022-08-28 ENCOUNTER — Ambulatory Visit (INDEPENDENT_AMBULATORY_CARE_PROVIDER_SITE_OTHER): Payer: Medicaid Other | Admitting: Family Medicine

## 2022-08-28 ENCOUNTER — Encounter: Payer: Self-pay | Admitting: Family Medicine

## 2022-08-28 VITALS — BP 120/80 | HR 90 | Temp 98.7°F | Ht 63.0 in | Wt 231.0 lb

## 2022-08-28 DIAGNOSIS — K2100 Gastro-esophageal reflux disease with esophagitis, without bleeding: Secondary | ICD-10-CM

## 2022-08-28 DIAGNOSIS — J00 Acute nasopharyngitis [common cold]: Secondary | ICD-10-CM | POA: Insufficient documentation

## 2022-08-28 LAB — POCT RAPID STREP A (OFFICE): Rapid Strep A Screen: NEGATIVE

## 2022-08-28 MED ORDER — CETIRIZINE HCL 10 MG PO TABS: 10.0000 mg | ORAL_TABLET | Freq: Every day | ORAL | 11 refills | Status: DC

## 2022-08-28 MED ORDER — GUAIFENESIN ER 600 MG PO TB12: 600.0000 mg | ORAL_TABLET | Freq: Two times a day (BID) | ORAL | 3 refills | Status: DC

## 2022-08-28 MED ORDER — PROMETHAZINE-DM 6.25-15 MG/5ML PO SYRP: 5.0000 mL | ORAL_SOLUTION | Freq: Four times a day (QID) | ORAL | 0 refills | Status: DC | PRN

## 2022-08-28 MED ORDER — FLUTICASONE PROPIONATE 50 MCG/ACT NA SUSP: 2.0000 | Freq: Every day | NASAL | 0 refills | Status: DC

## 2022-08-28 NOTE — Assessment & Plan Note (Signed)
1 day history of dysphagia, associated congestion, rhinorrhea, nonproductive cough, endorses 1 sick contact (sister) with similar symptoms.  Denies any fevers or chills.  Examination with erythematous turbinates, mildly swollen, oropharynx with subtle cobblestone pattern but otherwise reassuring without exudates or frank injection, tympanic membranes and canals benign, shotty cervical lymphadenopathy, subtle tenderness about the ethmoid and maxillary regions, clear lung fields throughout and benign cardiac sounds.  Patient's features are most consistent with rhinitis and postnasal drip, OTC regimen conveyed, if still symptomatic by next week (7+ days from symptom onset), anticipate escalation of treatment.  Patient was advised to contact our office if that is the case.

## 2022-08-28 NOTE — Assessment & Plan Note (Signed)
Chronic condition, ongoing symptomatology, has noted mild improvement with dietary modifications however still symptomatic despite pantoprazole course.  Referral to GI placed today for further evaluation and management options, from a medication management standpoint, transition to as needed pantoprazole and continued dietary modifications over interim.

## 2022-08-28 NOTE — Patient Instructions (Addendum)
Take the following x7 days Flonase (fluticasone propionate), 2 sprays in each nostril Zyrtec (cetirizine), 1 tablet daily Mucinex (guaifenesin), 1 tablet twice daily  Use the following as needed Promethazine-DM cough syrup Saline spray Tylenol (acetaminophen) alternating with ibuprofen  If still symptomatic after 7 days despite the above, contact us for next steps.  - Referral coordinator will contact you for scheduling visit with GI - Return for annual physical in 2 months

## 2022-08-28 NOTE — Progress Notes (Signed)
     Primary Care / Sports Medicine Office Visit  Patient Information:  Patient ID: Kara House, female DOB: [birth year] Age: 21 y.o. MRN: [id number]   Kara House is a pleasant 21 y.o. female presenting with the following:  Chief Complaint  Patient presents with   Sore Throat    Pt here with C/O sore throat for 1 day, hard to swallow, does have cough and runny nose.     Vitals:   08/28/22 0934  BP: 120/80  Pulse: 90  Temp: 98.7 F (37.1 C)  SpO2: 98%   Vitals:   08/28/22 0934  Weight: 231 lb (104.8 kg)  Height: 5\' 3"  (1.6 m)   Body mass index is 40.92 kg/m.  No results found.   Independent interpretation of notes and tests performed by another provider:   None  Procedures performed:   None  Pertinent History, Exam, Impression, and Recommendations:   Problem List Items Addressed This Visit       Respiratory   Acute rhinitis - Primary    1 day history of dysphagia, associated congestion, rhinorrhea, nonproductive cough, endorses 1 sick contact (sister) with similar symptoms.  Denies any fevers or chills.  Examination with erythematous turbinates, mildly swollen, oropharynx with subtle cobblestone pattern but otherwise reassuring without exudates or frank injection, tympanic membranes and canals benign, shotty cervical lymphadenopathy, subtle tenderness about the ethmoid and maxillary regions, clear lung fields throughout and benign cardiac sounds.  Patient's features are most consistent with rhinitis and postnasal drip, OTC regimen conveyed, if still symptomatic by next week (7+ days from symptom onset), anticipate escalation of treatment.  Patient was advised to contact our office if that is the case.      Relevant Orders   POCT rapid strep A (Completed)     Digestive   Gastroesophageal reflux disease with esophagitis without hemorrhage    Chronic condition, ongoing symptomatology, has noted mild improvement with dietary modifications however still  symptomatic despite pantoprazole course.  Referral to GI placed today for further evaluation and management options, from a medication management standpoint, transition to as needed pantoprazole and continued dietary modifications over interim.      Relevant Orders   Ambulatory referral to Gastroenterology     Orders & Medications Meds ordered this encounter  Medications   promethazine-dextromethorphan (PROMETHAZINE-DM) 6.25-15 MG/5ML syrup    Sig: Take 5 mLs by mouth 4 (four) times daily as needed for cough.    Dispense:  118 mL    Refill:  0   fluticasone (FLONASE) 50 MCG/ACT nasal spray    Sig: Place 2 sprays into both nostrils daily.    Dispense:  16 g    Refill:  0   cetirizine (ZYRTEC) 10 MG tablet    Sig: Take 1 tablet (10 mg total) by mouth daily.    Dispense:  30 tablet    Refill:  11   guaiFENesin (MUCINEX) 600 MG 12 hr tablet    Sig: Take 1 tablet (600 mg total) by mouth 2 (two) times daily.    Dispense:  60 tablet    Refill:  3   Orders Placed This Encounter  Procedures   Ambulatory referral to Gastroenterology   POCT rapid strep A     Return in about 2 months (around 10/28/2022) for CPE.     10/30/2022, MD   Primary Care Sports Medicine [facility]

## 2022-09-22 IMAGING — US US OB < 14 WEEKS - US OB TV
1 series · 14 of 28 positions shown · non-contrast
Comparison: None.

CLINICAL DATA: Vaginal bleeding.  Positive beta HCG

EXAM:
OBSTETRIC <14 WK US AND TRANSVAGINAL OB US
TECHNIQUE: Both transabdominal and transvaginal ultrasound examinations were
performed for complete evaluation of the gestation as well as the
maternal uterus, adnexal regions, and pelvic cul-de-sac.
Transvaginal technique was performed to assess early pregnancy.

[Series 1: us ob less than 14 weeks with ob transvaginal · 14 of 124 slices shown]
[im 5/124]
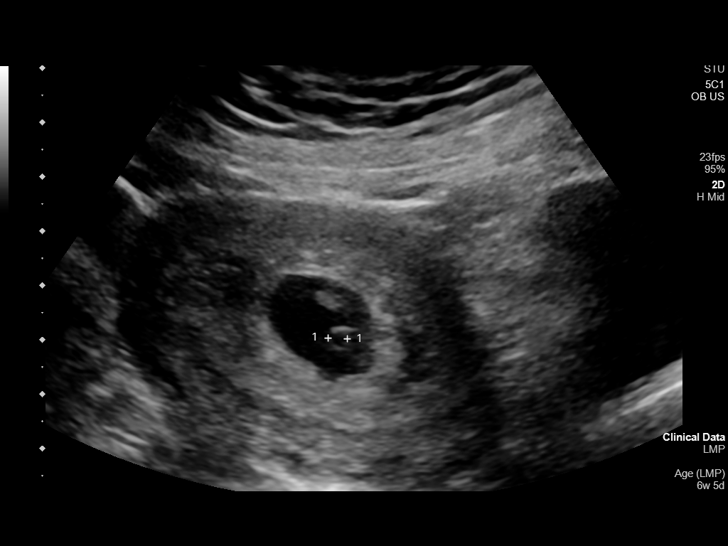
[im 14/124]
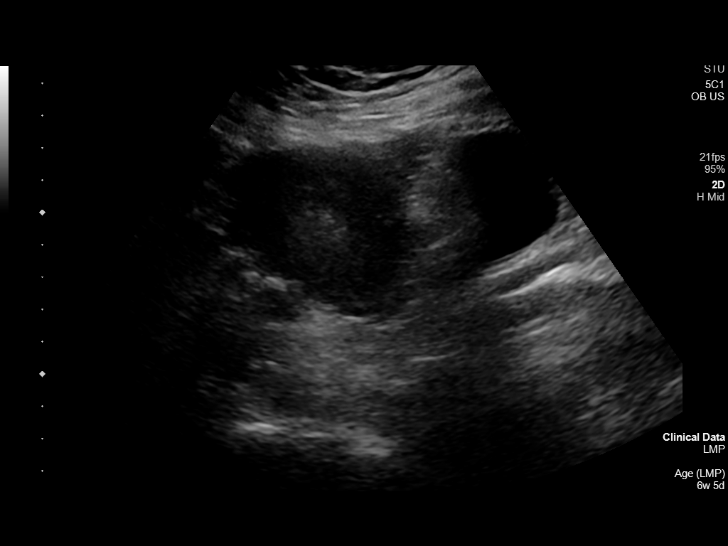
[im 23/124]
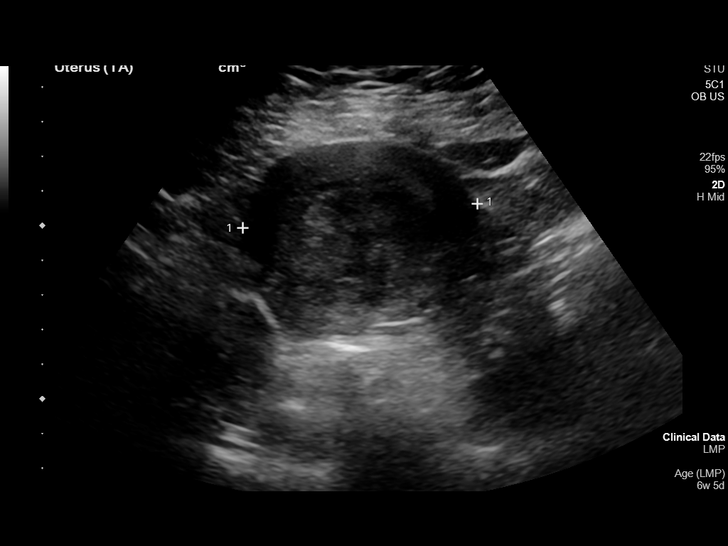
[im 32/124]
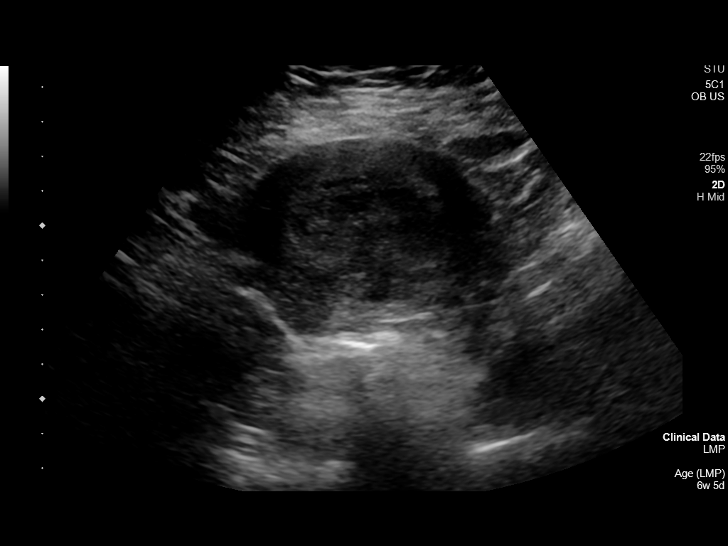
[im 42/124]
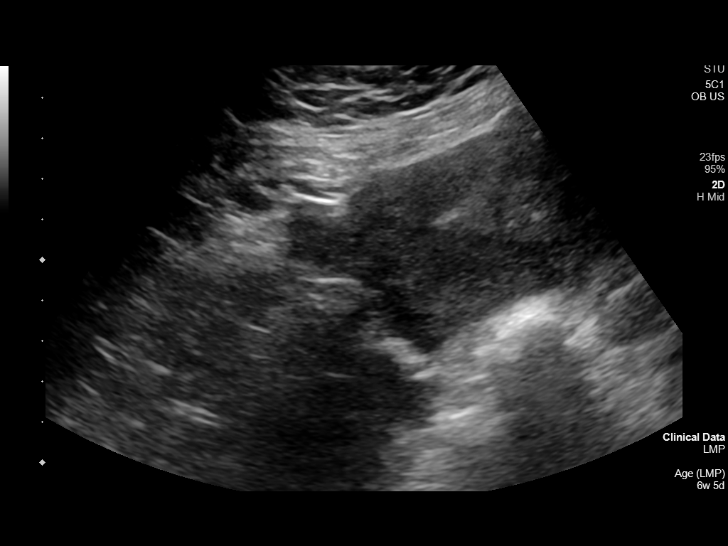
[im 51/124]
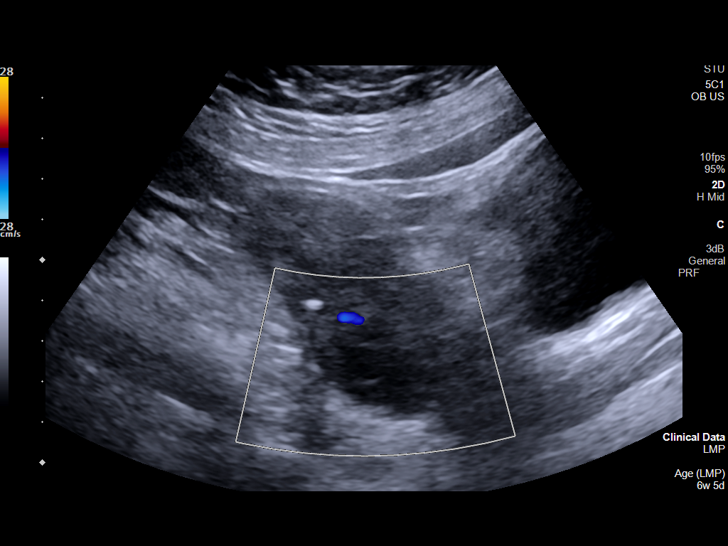
[im 60/124]
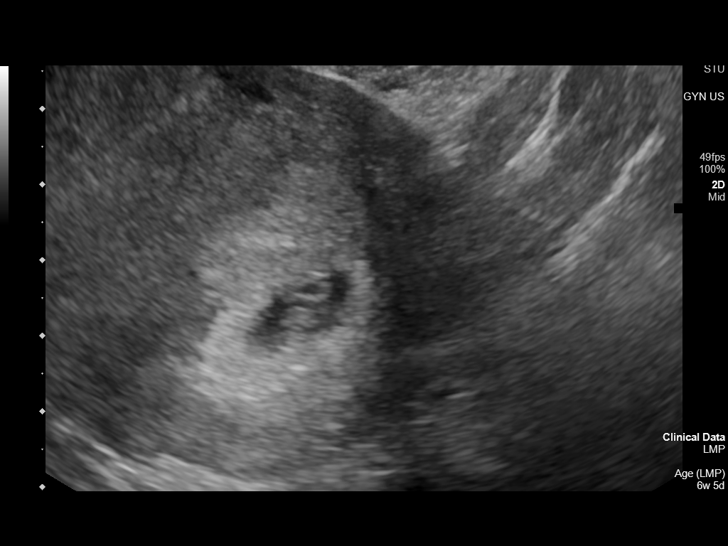
[im 69/124]
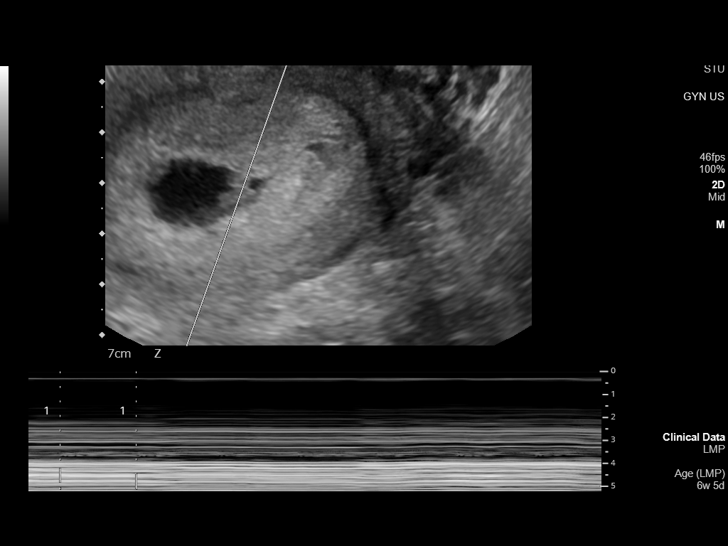
[im 78/124]
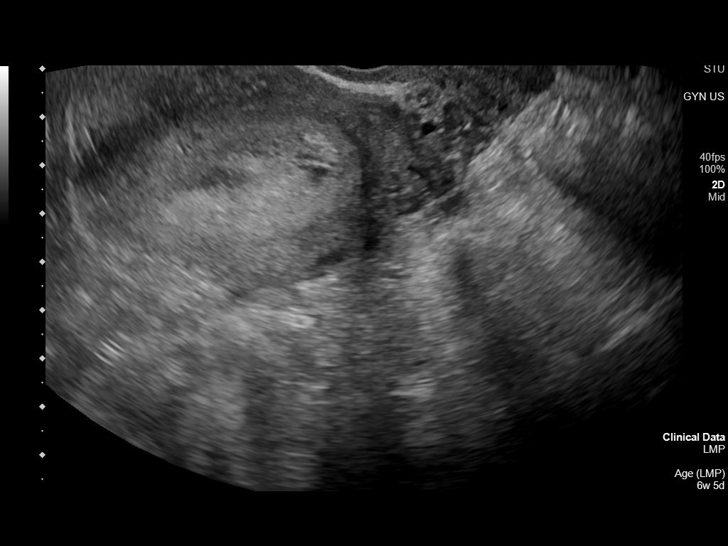
[im 87/124]
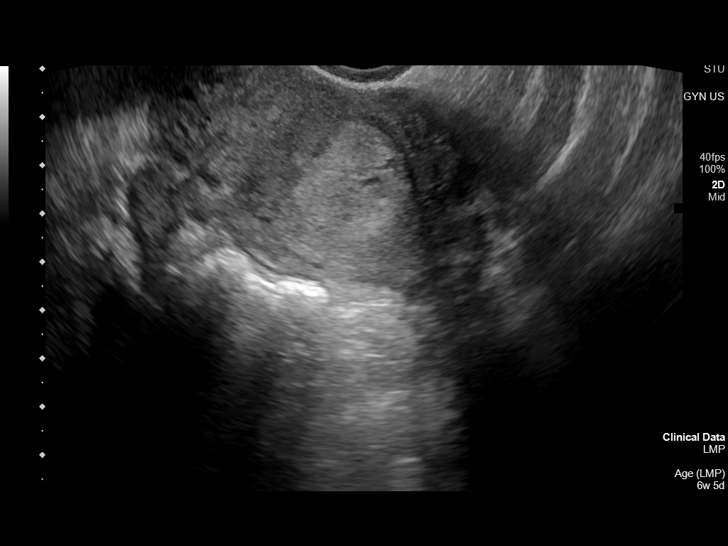
[im 96/124]
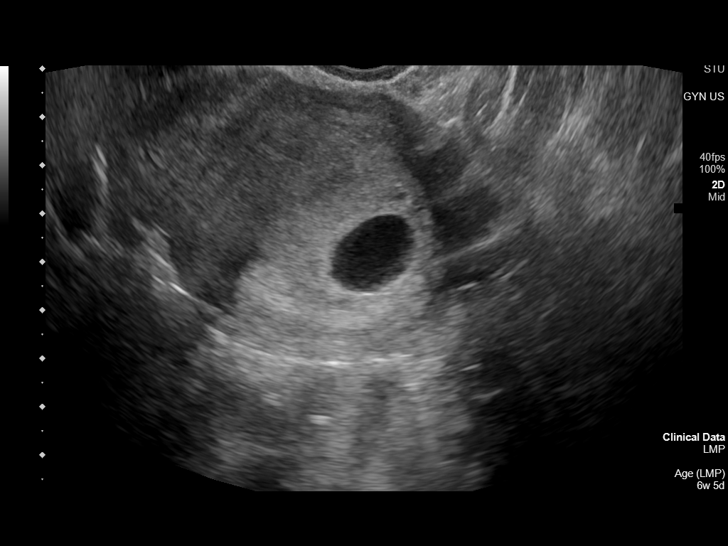
[im 105/124]
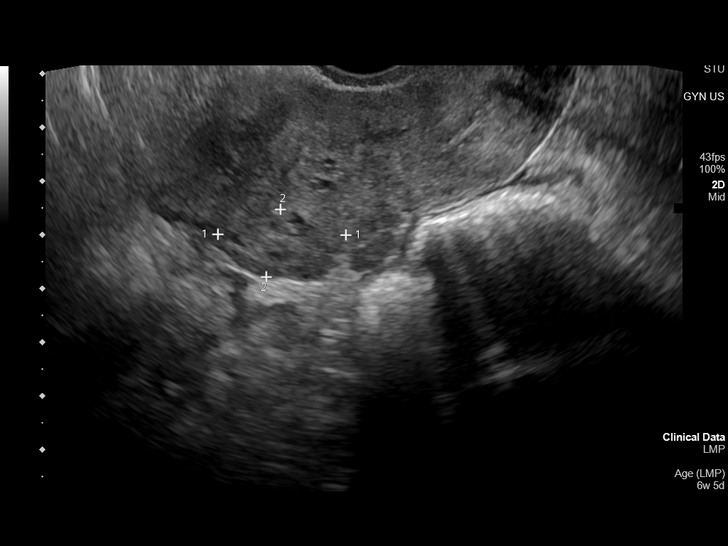
[im 114/124]
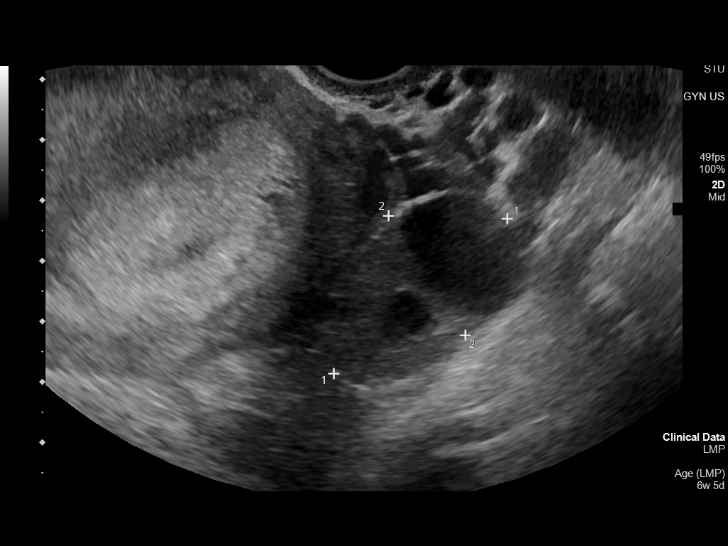
[im 124/124]
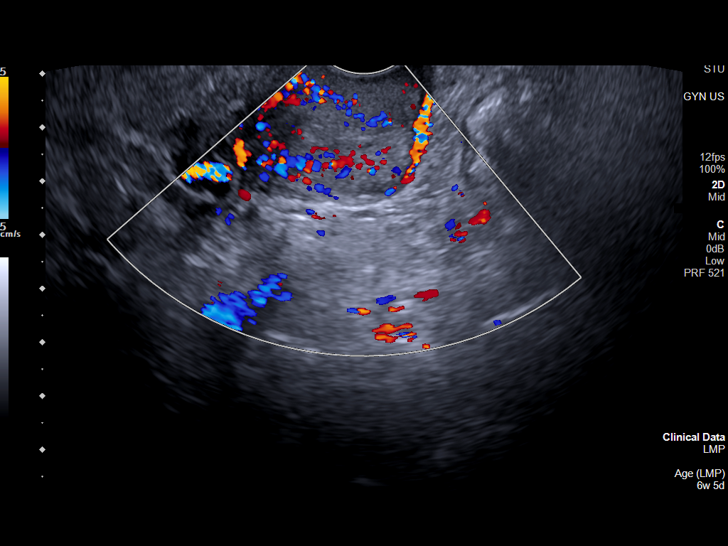

[14 of 28 positions shown; findings below may reference images not displayed]

FINDINGS: Intrauterine gestational sac: Single

Yolk sac:  Visualized.

Embryo:  Visualized.

Cardiac Activity: Visualized.

Heart Rate: 135 bpm

CRL:  8.2 mm   6 w   5 d                  US EDC: 11/22/2020

Subchorionic hemorrhage: Small subchorionic hemorrhage measuring 7 x
5 x 6 mm.

Maternal uterus/adnexae: Right ovary is unremarkable. Probable left
ovarian corpus luteal cyst. Trace free fluid within the cul-de-sac.
IMPRESSION: 1. Single live intrauterine gestation measuring 6 weeks 5 days by
crown-rump length.
2. Active embryonic heart tones at 135 beats per minute.
3. Small subchorionic hemorrhage.

## 2022-10-29 ENCOUNTER — Encounter: Payer: Self-pay | Admitting: Family Medicine

## 2022-10-29 ENCOUNTER — Ambulatory Visit (INDEPENDENT_AMBULATORY_CARE_PROVIDER_SITE_OTHER): Payer: Medicaid Other | Admitting: Family Medicine

## 2022-10-29 VITALS — BP 112/80 | HR 82 | Ht 63.0 in | Wt 229.0 lb

## 2022-10-29 DIAGNOSIS — Z13220 Encounter for screening for lipoid disorders: Secondary | ICD-10-CM

## 2022-10-29 DIAGNOSIS — L732 Hidradenitis suppurativa: Secondary | ICD-10-CM | POA: Diagnosis not present

## 2022-10-29 DIAGNOSIS — Z114 Encounter for screening for human immunodeficiency virus [HIV]: Secondary | ICD-10-CM

## 2022-10-29 DIAGNOSIS — Z1159 Encounter for screening for other viral diseases: Secondary | ICD-10-CM | POA: Diagnosis not present

## 2022-10-29 DIAGNOSIS — E210 Primary hyperparathyroidism: Secondary | ICD-10-CM

## 2022-10-29 DIAGNOSIS — K2100 Gastro-esophageal reflux disease with esophagitis, without bleeding: Secondary | ICD-10-CM

## 2022-10-29 DIAGNOSIS — Z7689 Persons encountering health services in other specified circumstances: Secondary | ICD-10-CM | POA: Insufficient documentation

## 2022-10-29 DIAGNOSIS — Z0000 Encounter for general adult medical examination without abnormal findings: Secondary | ICD-10-CM | POA: Diagnosis not present

## 2022-10-29 DIAGNOSIS — R718 Other abnormality of red blood cells: Secondary | ICD-10-CM | POA: Diagnosis not present

## 2022-10-29 MED ORDER — PANTOPRAZOLE SODIUM 40 MG PO TBEC: 40.0000 mg | DELAYED_RELEASE_TABLET | Freq: Every day | ORAL | 2 refills | Status: DC | PRN

## 2022-10-29 NOTE — Progress Notes (Signed)
Annual Physical Exam Visit  Patient Information:  Patient ID: Kara House, female DOB: 2000/12/30 Age: 22 y.o. MRN: [id number]   Subjective:   CC: Annual Physical Exam  HPI:  Kara House is here for their annual physical.  I reviewed the past medical history, family history, social history, surgical history, and allergies today and changes were made as necessary.  Please see the problem list section below for additional details.  Past Medical History: History reviewed. No pertinent past medical history. Past Surgical History: Past Surgical History:  Procedure Laterality Date   PARATHYROIDECTOMY  11/2021   TONSILLECTOMY     Family History: History reviewed. No pertinent family history. Allergies: No Known Allergies Health Maintenance: Health Maintenance  Topic Date Due   Hepatitis C Screening  Never done   PAP-Cervical Cytology Screening  Never done   PAP SMEAR-Modifier  Never done   INFLUENZA VACCINE  01/10/2023 (Originally 05/12/2022)   HPV VACCINES (1 - 2-dose series) 07/08/2023 (Originally 03/20/2011)   COVID-19 Vaccine (1) 07/23/2026 (Originally 09/18/2000)   DTaP/Tdap/Td (3 - Td or Tdap) 09/11/2031   HIV Screening  Completed    HM Colonoscopy     This patient has no relevant Health Maintenance data.      Medications: No current outpatient medications on file prior to visit.   No current facility-administered medications on file prior to visit.    Review of Systems: No headache, visual changes, nausea, vomiting, diarrhea, constipation, dizziness, abdominal pain, skin rash, fevers, chills, night sweats, swollen lymph nodes, weight loss, chest pain, body aches, joint swelling, muscle aches, shortness of breath, mood changes, visual or auditory hallucinations reported.  Objective:   Vitals:   10/29/22 0808  BP: 112/80  Pulse: 82  SpO2: 97%   Vitals:   10/29/22 0808  Weight: 229 lb (103.9 kg)  Height: 5\' 3"  (1.6 m)   Body mass index is 40.57  kg/m.  General: Well Developed, well nourished, and in no acute distress.  Neuro: Alert and oriented x3, extra-ocular muscles intact, sensation grossly intact. Cranial nerves II through XII are grossly intact, motor, sensory, and coordinative functions are intact. HEENT: Normocephalic, atraumatic, pupils equal round reactive to light, neck supple, no masses, no lymphadenopathy, thyroid nonpalpable. Oropharynx, nasopharynx, external ear canals are unremarkable. Skin: Warm and dry, no rashes noted.  Cardiac: Regular rate and rhythm, no murmurs rubs or gallops. No peripheral edema. Pulses symmetric. Respiratory: Clear to auscultation bilaterally. Not using accessory muscles, speaking in full sentences.  Abdominal: Soft, nontender, nondistended, positive bowel sounds, no masses, no organomegaly. Musculoskeletal: Shoulder, elbow, wrist, hip, knee, ankle stable, and with full range of motion.  Female chaperone initials: CM present throughout the physical examination.  Impression and Recommendations:   The patient was counselled, risk factors were discussed, and anticipatory guidance given.  Problem List Items Addressed This Visit       Digestive   Gastroesophageal reflux disease with esophagitis without hemorrhage    Chronic, stable and mostly controlled with dietary changes (reducing fatty/greasy foods, earlier dinnertime, etc.).  Has not required pantoprazole on a regular basis.  Did discuss stepwise treatment for reflux with continued focus on lifestyle modifications.  She does have upcoming follow-up with GI, will follow peripherally.      Relevant Medications   pantoprazole (PROTONIX) 40 MG tablet     Endocrine   Hyperparathyroidism, primary ([place])    Follows with [facility] for this issue.        Musculoskeletal and Integument  Hidradenitis suppurativa    Significant improvement with prior management, does still have upcoming visit with dermatology which I encouraged her to  maintain for further optimization of this issue.        Other   Annual physical exam - Primary    Annual examination completed, risk stratification labs ordered, anticipatory guidance provided.  We will follow labs once resulted.      Relevant Orders   CBC   Comprehensive metabolic panel   Hepatitis C antibody   HIV Antibody (routine testing w rflx)   Lipid panel   TSH   Other Visit Diagnoses     Screening for HIV (human immunodeficiency virus)       Relevant Orders   HIV Antibody (routine testing w rflx)   Need for hepatitis C screening test       Relevant Orders   Hepatitis C antibody   Screening for lipoid disorders       Relevant Orders   Comprehensive metabolic panel   Lipid panel        Orders & Medications Medications:  Meds ordered this encounter  Medications   pantoprazole (PROTONIX) 40 MG tablet    Sig: Take 1 tablet (40 mg total) by mouth daily as needed. 30+ minutes prior to dinner on empty stomach.    Dispense:  30 tablet    Refill:  2   Orders Placed This Encounter  Procedures   CBC   Comprehensive metabolic panel   Hepatitis C antibody   HIV Antibody (routine testing w rflx)   Lipid panel   TSH     Return in about 6 months (around 04/29/2023) for f/u for derm GI.    Montel Culver, MD, [facility]   Primary Care Sports Medicine Primary Care and Sports Medicine at [facility]

## 2022-10-29 NOTE — Assessment & Plan Note (Signed)
Follows with [facility] for this issue.

## 2022-10-29 NOTE — Assessment & Plan Note (Signed)
Chronic, stable and mostly controlled with dietary changes (reducing fatty/greasy foods, earlier dinnertime, etc.).  Has not required pantoprazole on a regular basis.  Did discuss stepwise treatment for reflux with continued focus on lifestyle modifications.  She does have upcoming follow-up with GI, will follow peripherally.

## 2022-10-29 NOTE — Patient Instructions (Addendum)
-  Obtain fasting labs with orders provided (can have water or black coffee but otherwise no food or drink x 8 hours before labs) - Review information provided - Attend eye doctor annually, dentist every 6 months, work towards or maintain 30 minutes of moderate intensity physical activity at least 5 days per week, and consume a balanced diet - Return in 1 year for physical - Contact us for any questions between now and then  - Continue lifestyle changes for reflux - Can take medication (pantoprazole) as-needed for severe reflux - For minor reflux can use over-the-counter Tums or Pepcid - Follow-up with your gynecologist for routine screening

## 2022-10-29 NOTE — Assessment & Plan Note (Signed)
Significant improvement with prior management, does still have upcoming visit with dermatology which I encouraged her to maintain for further optimization of this issue.

## 2022-10-29 NOTE — Assessment & Plan Note (Signed)
Annual examination completed, risk stratification labs ordered, anticipatory guidance provided.  We will follow labs once resulted. 

## 2022-10-30 LAB — COMPREHENSIVE METABOLIC PANEL
ALT: 27 IU/L (ref 0–32)
AST: 19 IU/L (ref 0–40)
Albumin/Globulin Ratio: 1.6 (ref 1.2–2.2)
Albumin: 4.6 g/dL (ref 4.0–5.0)
Alkaline Phosphatase: 122 IU/L — ABNORMAL HIGH (ref 44–121)
BUN/Creatinine Ratio: 18 (ref 9–23)
BUN: 10 mg/dL (ref 6–20)
Bilirubin Total: 0.3 mg/dL (ref 0.0–1.2)
CO2: 19 mmol/L — ABNORMAL LOW (ref 20–29)
Calcium: 9.3 mg/dL (ref 8.7–10.2)
Chloride: 103 mmol/L (ref 96–106)
Creatinine, Ser: 0.57 mg/dL (ref 0.57–1.00)
Globulin, Total: 2.9 g/dL (ref 1.5–4.5)
Glucose: 80 mg/dL (ref 70–99)
Potassium: 4.4 mmol/L (ref 3.5–5.2)
Sodium: 139 mmol/L (ref 134–144)
Total Protein: 7.5 g/dL (ref 6.0–8.5)
eGFR: 132 mL/min/{1.73_m2} (ref >59)

## 2022-10-30 LAB — CBC
Hematocrit: 35.5 % (ref 34.0–46.6)
Hemoglobin: 11.2 g/dL (ref 11.1–15.9)
MCH: 22.7 pg — ABNORMAL LOW (ref 26.6–33.0)
MCHC: 31.5 g/dL (ref 31.5–35.7)
MCV: 72 fL — ABNORMAL LOW (ref 79–97)
Platelets: 470 10*3/uL — ABNORMAL HIGH (ref 150–450)
RBC: 4.94 x10E6/uL (ref 3.77–5.28)
RDW: 13.5 % (ref 11.7–15.4)
WBC: 6.3 10*3/uL (ref 3.4–10.8)

## 2022-10-30 LAB — LIPID PANEL
Chol/HDL Ratio: 3 ratio (ref 0.0–4.4)
Cholesterol, Total: 140 mg/dL (ref 100–199)
HDL: 46 mg/dL (ref >39)
LDL Chol Calc (NIH): 75 mg/dL (ref 0–99)
Triglycerides: 105 mg/dL (ref 0–149)
VLDL Cholesterol Cal: 19 mg/dL (ref 5–40)

## 2022-10-30 LAB — TSH: TSH: 2.67 u[IU]/mL (ref 0.450–4.500)

## 2022-10-30 LAB — HEPATITIS C ANTIBODY: Hep C Virus Ab: NONREACTIVE

## 2022-10-30 LAB — HIV ANTIBODY (ROUTINE TESTING W REFLEX): HIV Screen 4th Generation wRfx: NONREACTIVE

## 2022-11-03 NOTE — Progress Notes (Signed)
Lab added R71.8

## 2022-11-04 LAB — FERRITIN: Ferritin: 4 ng/mL — ABNORMAL LOW (ref 15–150)

## 2022-11-04 LAB — IRON AND TIBC
Iron Saturation: 6 % — CL (ref 15–55)
Iron: 29 ug/dL (ref 27–159)
Total Iron Binding Capacity: 469 ug/dL — ABNORMAL HIGH (ref 250–450)
UIBC: 440 ug/dL — ABNORMAL HIGH (ref 131–425)

## 2022-11-04 LAB — SPECIMEN STATUS REPORT

## 2022-11-05 ENCOUNTER — Other Ambulatory Visit: Payer: Self-pay

## 2022-11-05 DIAGNOSIS — R718 Other abnormality of red blood cells: Secondary | ICD-10-CM

## 2022-11-05 DIAGNOSIS — E611 Iron deficiency: Secondary | ICD-10-CM

## 2022-11-05 NOTE — Progress Notes (Signed)
Orders done, waiting on pt to call back with appointment

## 2022-11-07 ENCOUNTER — Emergency Department
Admission: EM | Admit: 2022-11-07 | Discharge: 2022-11-07 | Disposition: A | Discharge status: Home / Self Care | Payer: Medicaid Other | Attending: Emergency Medicine | Admitting: Emergency Medicine

## 2022-11-07 ENCOUNTER — Other Ambulatory Visit: Payer: Self-pay

## 2022-11-07 ENCOUNTER — Emergency Department: Payer: Medicaid Other

## 2022-11-07 DIAGNOSIS — K3189 Other diseases of stomach and duodenum: Secondary | ICD-10-CM | POA: Diagnosis not present

## 2022-11-07 DIAGNOSIS — N210 Calculus in bladder: Secondary | ICD-10-CM | POA: Diagnosis not present

## 2022-11-07 DIAGNOSIS — N200 Calculus of kidney: Secondary | ICD-10-CM | POA: Diagnosis not present

## 2022-11-07 DIAGNOSIS — N2889 Other specified disorders of kidney and ureter: Secondary | ICD-10-CM | POA: Diagnosis not present

## 2022-11-07 DIAGNOSIS — R1031 Right lower quadrant pain: Secondary | ICD-10-CM | POA: Diagnosis present

## 2022-11-07 LAB — HEPATIC FUNCTION PANEL
ALT: 18 U/L (ref 0–44)
AST: 17 U/L (ref 15–41)
Albumin: 4.4 g/dL (ref 3.5–5.0)
Alkaline Phosphatase: 102 U/L (ref 38–126)
Bilirubin, Direct: <0.1 mg/dL (ref 0.0–0.2)
Total Bilirubin: 0.6 mg/dL (ref 0.3–1.2)
Total Protein: 8.1 g/dL (ref 6.5–8.1)

## 2022-11-07 LAB — CBC
HCT: 37.8 % (ref 36.0–46.0)
Hemoglobin: 11 g/dL — ABNORMAL LOW (ref 12.0–15.0)
MCH: 21.9 pg — ABNORMAL LOW (ref 26.0–34.0)
MCHC: 29.1 g/dL — ABNORMAL LOW (ref 30.0–36.0)
MCV: 75.3 fL — ABNORMAL LOW (ref 80.0–100.0)
Platelets: 475 10*3/uL — ABNORMAL HIGH (ref 150–400)
RBC: 5.02 MIL/uL (ref 3.87–5.11)
RDW: 14.4 % (ref 11.5–15.5)
WBC: 6.9 10*3/uL (ref 4.0–10.5)
nRBC: 0 % (ref 0.0–0.2)

## 2022-11-07 LAB — URINALYSIS, ROUTINE W REFLEX MICROSCOPIC
Bilirubin Urine: NEGATIVE
Glucose, UA: NEGATIVE mg/dL
Ketones, ur: NEGATIVE mg/dL
Nitrite: NEGATIVE
Protein, ur: NEGATIVE mg/dL
Specific Gravity, Urine: 1.01 (ref 1.005–1.030)
WBC, UA: >50 WBC/hpf (ref 0–5)
pH: 6 (ref 5.0–8.0)

## 2022-11-07 LAB — BASIC METABOLIC PANEL
Anion gap: 9 (ref 5–15)
BUN: 14 mg/dL (ref 6–20)
CO2: 22 mmol/L (ref 22–32)
Calcium: 9.1 mg/dL (ref 8.9–10.3)
Chloride: 105 mmol/L (ref 98–111)
Creatinine, Ser: 0.59 mg/dL (ref 0.44–1.00)
GFR, Estimated: >60 mL/min (ref >60)
Glucose, Bld: 89 mg/dL (ref 70–99)
Potassium: 3.8 mmol/L (ref 3.5–5.1)
Sodium: 136 mmol/L (ref 135–145)

## 2022-11-07 LAB — LIPASE, BLOOD: Lipase: 34 U/L (ref 11–51)

## 2022-11-07 LAB — PREGNANCY, URINE: Preg Test, Ur: NEGATIVE

## 2022-11-07 MED ORDER — SODIUM CHLORIDE 0.9 % IV SOLN
1.0000 g | Freq: Once | INTRAVENOUS | Status: DC
  Filled 2022-11-07: qty 10

## 2022-11-07 MED ORDER — CEPHALEXIN 500 MG PO CAPS: 500.0000 mg | ORAL_CAPSULE | Freq: Four times a day (QID) | ORAL | 0 refills | Status: AC

## 2022-11-07 MED ORDER — CEPHALEXIN 500 MG PO CAPS
500.0000 mg | ORAL_CAPSULE | Freq: Once | ORAL | Status: AC
  Administered 2022-11-07: 500 mg via ORAL
  Filled 2022-11-07: qty 1

## 2022-11-07 MED ORDER — KETOROLAC TROMETHAMINE 15 MG/ML IJ SOLN
15.0000 mg | Freq: Once | INTRAMUSCULAR | Status: AC
  Administered 2022-11-07: 15 mg via INTRAMUSCULAR
  Filled 2022-11-07: qty 1

## 2022-11-07 NOTE — Discharge Instructions (Signed)
On your CAT scan it looks like you likely passed a kidney stone.  We are also seeing calcium deposition in your kidney so I would like you to follow-up with nephrologist above.   You may also have an infection in your urine so please take the antibiotic 4 times a day for the next 7 days.

## 2022-11-07 NOTE — ED Triage Notes (Signed)
Pt reports an hour ago started with pain to her mid right side back that radiates around to her flank. Pt reports also has then sensation to urinate but has not been able to.

## 2022-11-07 NOTE — ED Provider Notes (Signed)
[facility] Provider Note    Event Date/Time   First MD Initiated Contact with Patient 11/07/22 1059     (approximate)   History   Back Pain and Flank Pain   HPI  Kara House is a 22 y.o. female with no significant past medical history who presents with right lower quadrant and flank pain.  Symptoms started about 2 hours ago came on rather acutely.  Pain is located in the right flank and radiates around to the right lower back.  She has sensation that she needs to pee but is not able to.  Denies nausea vomiting diarrhea constipation.  Pain has been fairly constant since onset.  No history of similar no history of kidney stones     No past medical history on file.  Patient Active Problem List   Diagnosis Date Noted   Annual physical exam 10/29/2022   Acute rhinitis 08/28/2022   Hidradenitis suppurativa 07/07/2022   Gastroesophageal reflux disease with esophagitis without hemorrhage 07/07/2022   IUD (intrauterine device) in place 02/26/2022   Hyperparathyroidism, primary ([place]) 10/22/2021   Elevated alkaline phosphatase level 10/02/2021   Hypercalcemia 06/12/2021   History of gestational hypertension 05/21/2021   BMI 39.0-39.9,adult 02/16/2020     Physical Exam  Triage Vital Signs: ED Triage Vitals  Enc Vitals Group     BP 11/07/22 1054 123/80     Pulse Rate 11/07/22 1054 100     Resp 11/07/22 1054 19     Temp 11/07/22 1054 97.7 F (36.5 C)     Temp Source 11/07/22 1054 Oral     SpO2 11/07/22 1054 99 %     Weight 11/07/22 1003 220 lb (99.8 kg)     Height 11/07/22 1003 5\' 3"  (1.6 m)     Head Circumference --      Peak Flow --      Pain Score 11/07/22 1003 8     Pain Loc --      Pain Edu? --      Excl. in [place]? --     Most recent vital signs: Vitals:   11/07/22 1054  BP: 123/80  Pulse: 100  Resp: 19  Temp: 97.7 F (36.5 C)  SpO2: 99%     General: Awake, no distress.  Patient looks comfortable CV:  Good peripheral perfusion.   Resp:  Normal effort.  Abd:  No distention.  Mild tenderness in the right lower quadrant but no guarding, no CVA tenderness Neuro:             Awake, Alert, Oriented x 3  Other:     ED Results / Procedures / Treatments  Labs (all labs ordered are listed, but only abnormal results are displayed) Labs Reviewed  URINALYSIS, ROUTINE W REFLEX MICROSCOPIC - Abnormal; Notable for the following components:      Result Value   Color, Urine YELLOW (*)    APPearance HAZY (*)    Hgb urine dipstick MODERATE (*)    Leukocytes,Ua LARGE (*)    Bacteria, UA RARE (*)    All other components within normal limits  CBC - Abnormal; Notable for the following components:   Hemoglobin 11.0 (*)    MCV 75.3 (*)    MCH 21.9 (*)    MCHC 29.1 (*)    Platelets 475 (*)    All other components within normal limits  URINE CULTURE  BASIC METABOLIC PANEL  HEPATIC FUNCTION PANEL  LIPASE, BLOOD  PREGNANCY, URINE  EKG     RADIOLOGY CT renal study reviewed interpreted myself shows stone in the bladder   PROCEDURES:  Critical Care performed: No  Procedures  The patient is on the cardiac monitor to evaluate for evidence of arrhythmia and/or significant heart rate changes.   MEDICATIONS ORDERED IN ED: Medications  ketorolac (TORADOL) 15 MG/ML injection 15 mg (15 mg Intramuscular Given 11/07/22 1215)  cephALEXin (KEFLEX) capsule 500 mg (500 mg Oral Given 11/07/22 1313)     IMPRESSION / MDM / ASSESSMENT AND PLAN / ED COURSE  I reviewed the triage vital signs and the nursing notes.                              Patient's presentation is most consistent with acute complicated illness / injury requiring diagnostic workup.  Differential diagnosis includes, but is not limited to, nephrolithiasis, pyelonephritis, ovarian cyst, ovarian torsion, appendicitis  The patient is a 22 year old female who presents with right flank pain that radiates to the right lower quadrant.  Patient says she felt some  what she thought was menstrual type cramping yesterday but was not significant.  This pain came on rather acutely about 2 hours ago and is located in the right CVA region and radiates around to the right lower abdomen some associated difficulty urinating.  No vaginal discharge vaginal bleeding no fevers chills nausea vomiting.  Patient's vital signs are reassuring overall she looks well and looks comfortable and nontoxic-appearing.  She has no clear CVA tenderness.  She has some mild tenderness in the right lower abdomen but no guarding and exam is nonperitoneal.  CBC BMP LFTs lipase are reassuring no leukocytosis.  UA is suggestive of possible infection with greater than 50 white cells 6-10 red cells.  Pyelonephritis certainly on the differential but somewhat unusual to start so acutely therefore I am suspicious for possible nephrolithiasis.  Less likely appendicitis given the acute nature.  Ovarian pathology also possible.  Will get CT renal study to rule out stone.  Feel it is okay to proceed without contrast given my concern for appendicitis is low.  We have added on urine culture and she will need a pregnancy test as well prior to imaging and pain medication.  CT renal study shows 3 mm stone in the dependent portion of bladder.  Patient's symptoms are much improved this is consistent with recently passed stone.  Given the significant pyuria we will treat with 7 days of Keflex and this could just be inflammatory due to nephrolithiasis.  Patient is afebrile.  No concern for sepsis at this time.  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  She is not hypercalcemic will refer to nephrology as an outpatient.  Discussed the findings with the patient.  She is appropriate for discharge.     FINAL CLINICAL IMPRESSION(S) / ED DIAGNOSES   Final diagnoses:  Kidney stone  Nephrocalcinosis     Rx / DC Orders   ED Discharge Orders          Ordered    cephALEXin (KEFLEX) 500 MG capsule  4  times daily        11/07/22 1322             Note:  This document was prepared using Dragon voice recognition software and may include unintentional dictation errors.   Rada Hay, MD 11/07/22 1323

## 2022-11-08 LAB — URINE CULTURE

## 2022-12-02 ENCOUNTER — Encounter: Payer: Self-pay | Admitting: Family Medicine

## 2022-12-18 IMAGING — US US OB LIMITED
1 series · 14 of 28 positions shown · non-contrast
Comparison: none

CLINICAL DATA: 20-year-old pregnant female with acute LEFT pelvic
pain for 1 day. Assigned gestational age of 19 weeks 1 day.

EXAM:
LIMITED OBSTETRIC ULTRASOUND AND DOPPLER US OF OVARIES

[Series 1: us ob comp + 14 wk · 14 of 45 slices shown]
[im 2/45]
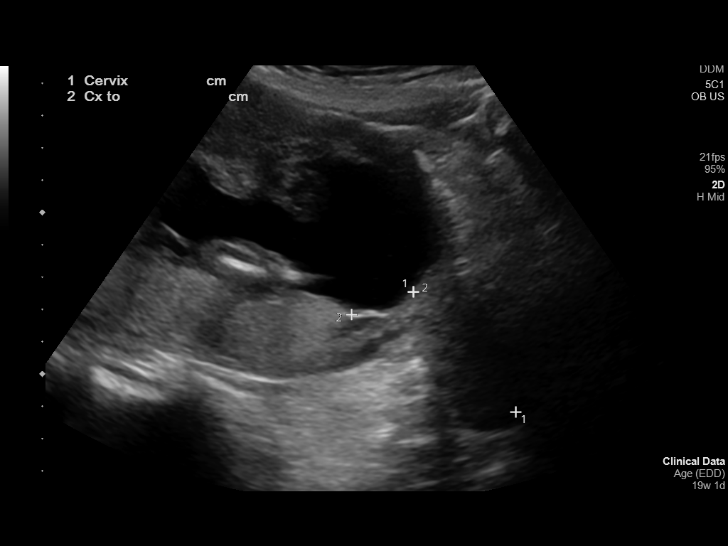
[im 5/45]
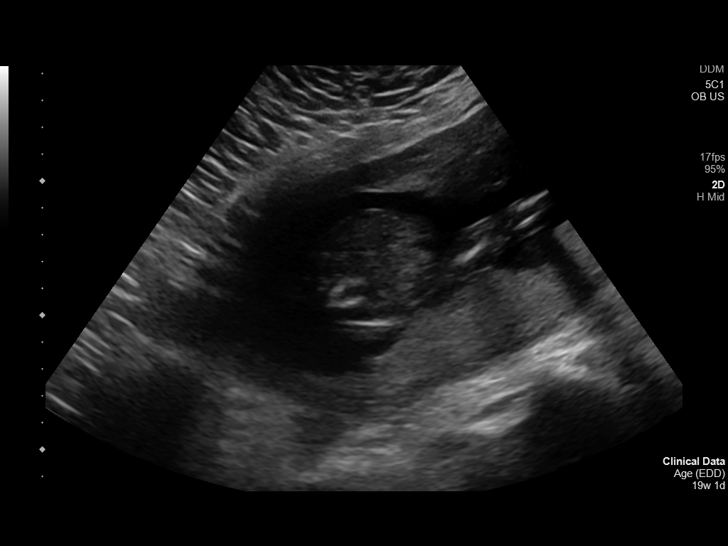
[im 9/45]
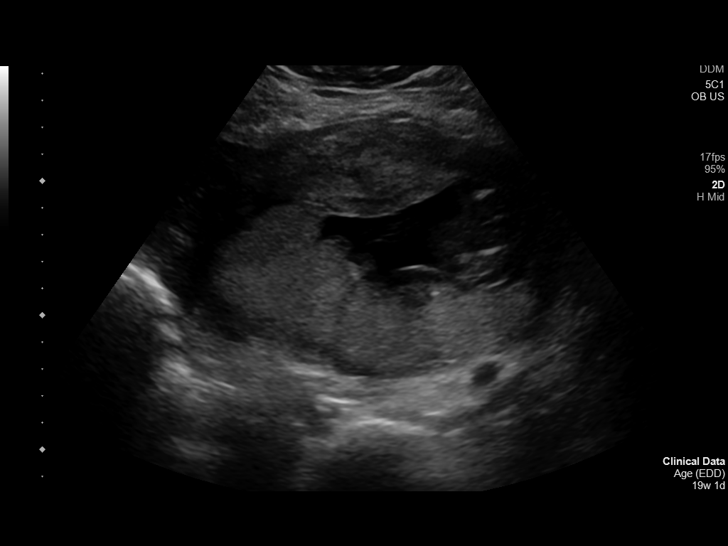
[im 12/45]
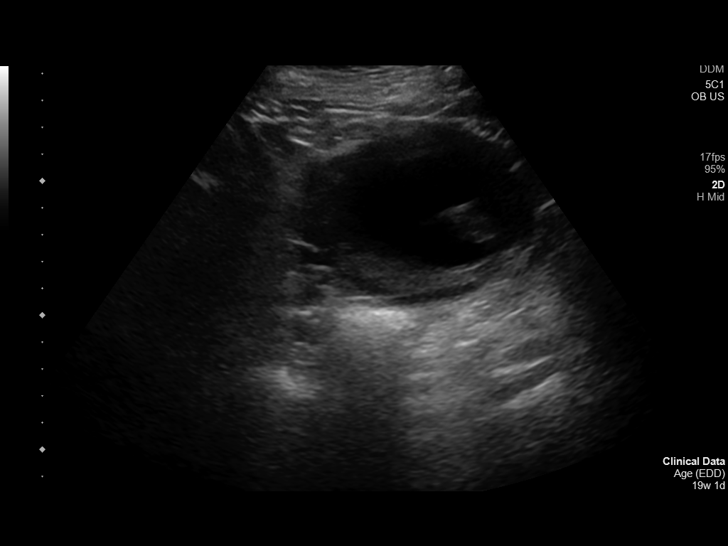
[im 15/45]
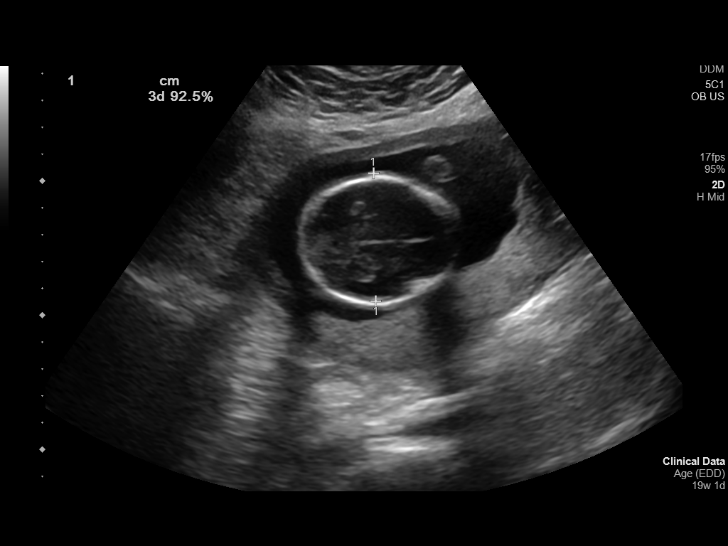
[im 18/45]
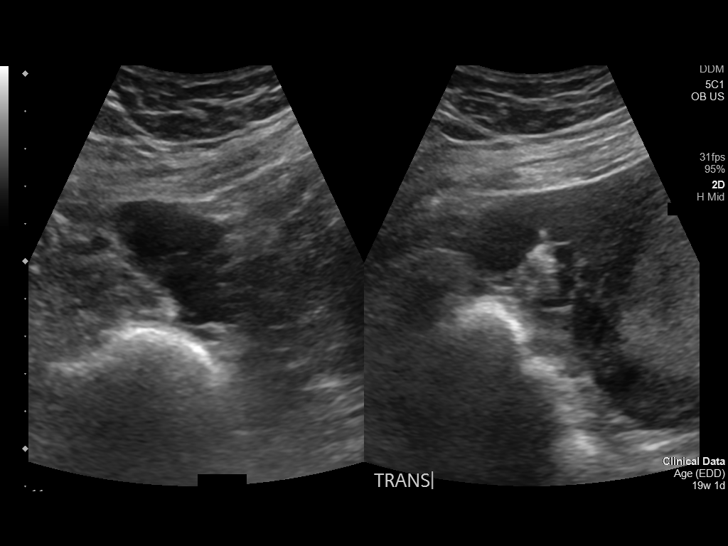
[im 22/45]
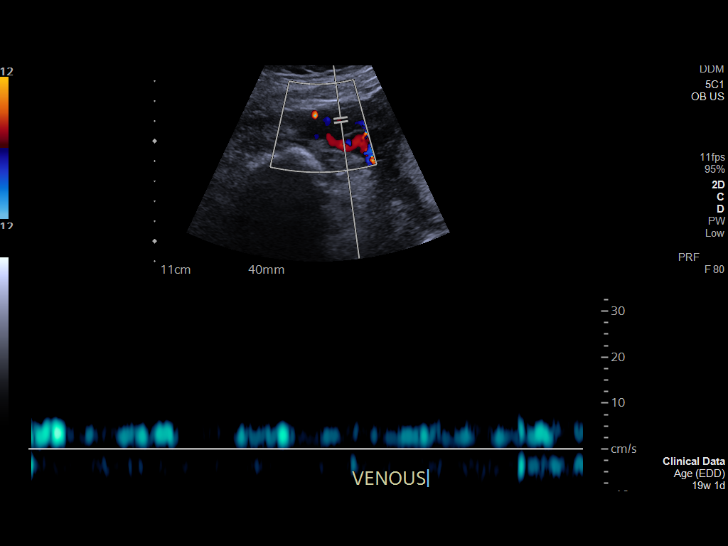
[im 25/45]
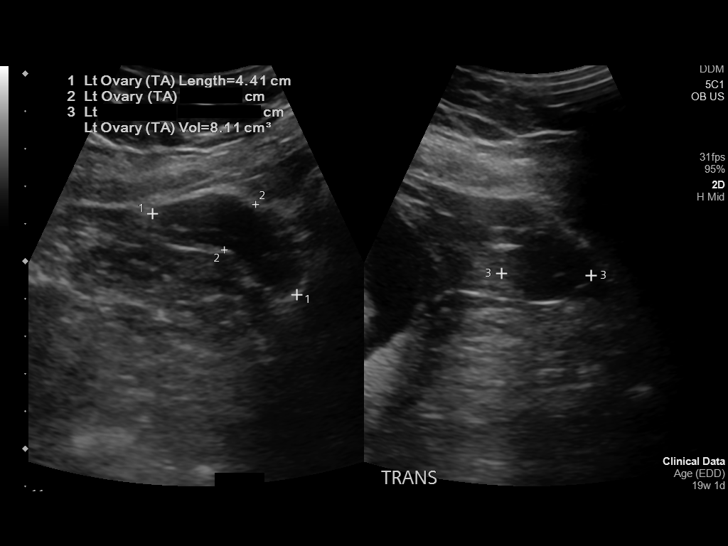
[im 28/45]
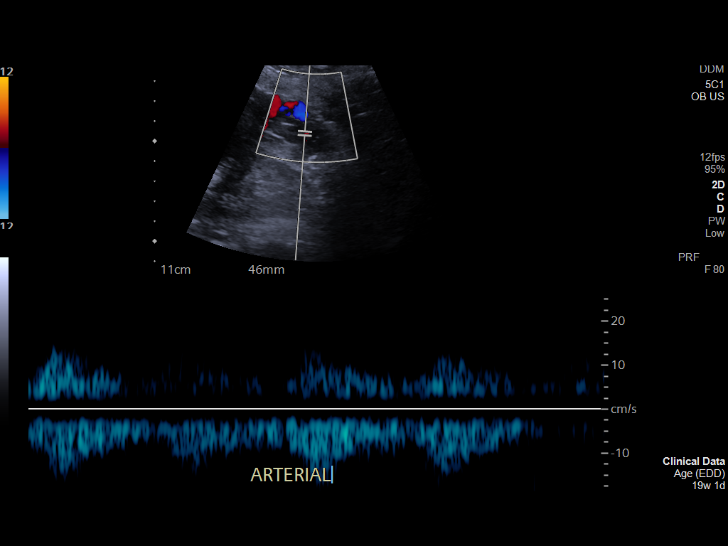
[im 31/45]
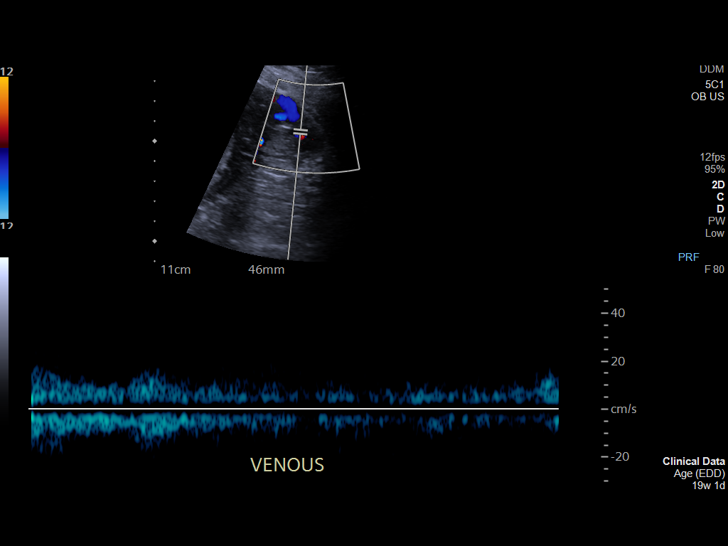
[im 35/45]
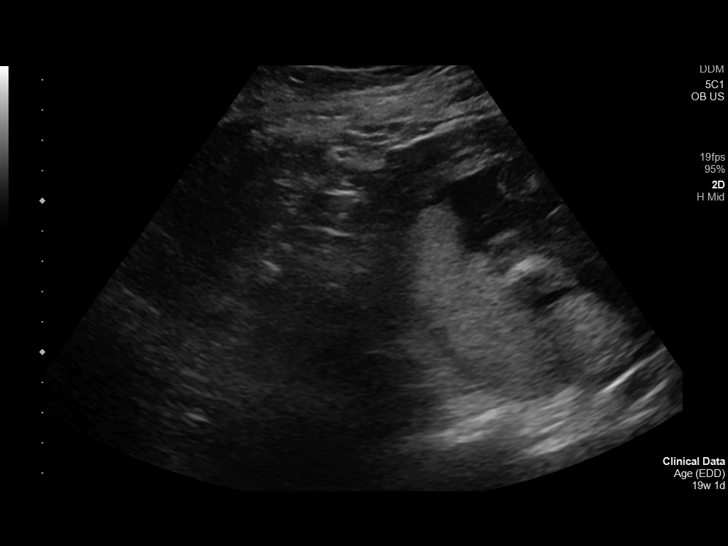
[im 38/45]
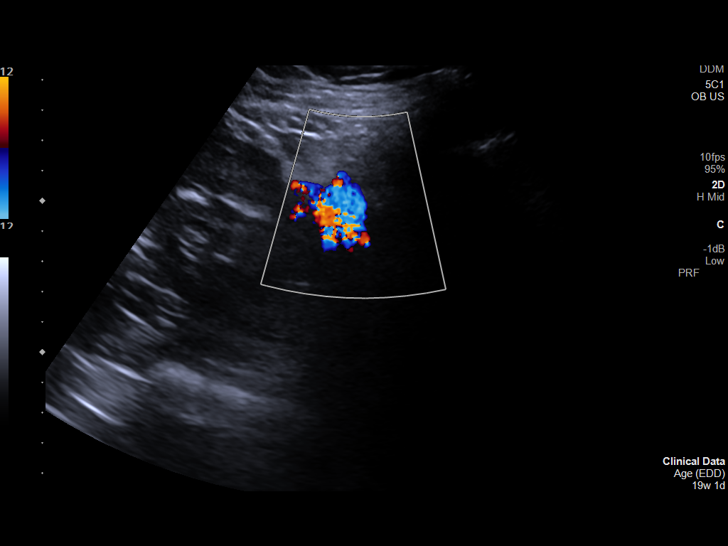
[im 41/45]
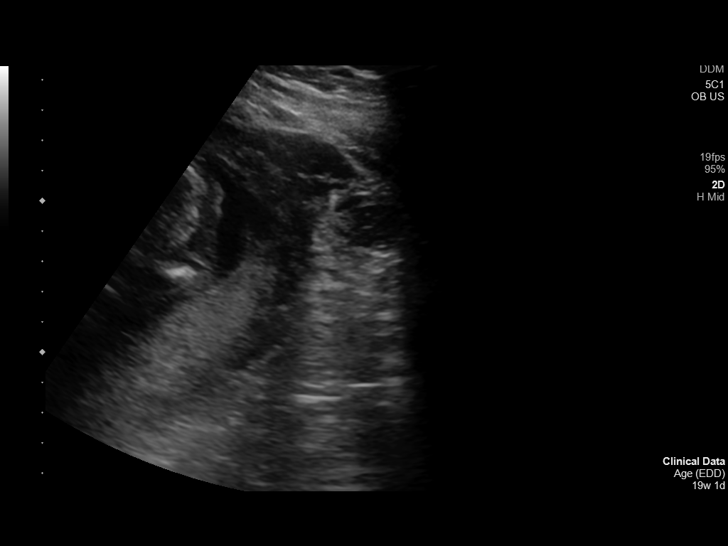
[im 45/45]
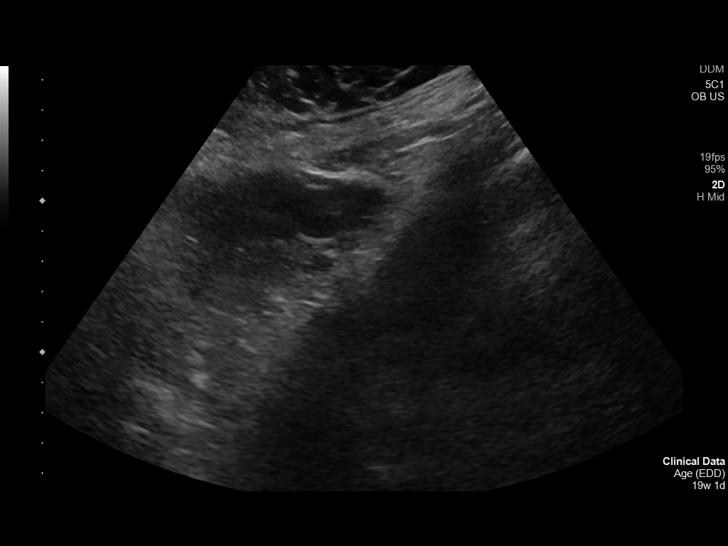

[14 of 28 positions shown; findings below may reference images not displayed]

FINDINGS: Number of Fetuses: 1

Heart Rate:  132 bpm

Movement: Yes

Presentation: Breech

Placental Location: Posterior

Previa: No

Amniotic Fluid (Subjective):  Within normal limits.

AFI: 5.6 cm

BPD:  4.8cm 20w 3d

MATERNAL FINDINGS:

Cervix:  Appears closed.  5 cm length.

Uterus/Adnexae: No abnormality visualized.

Pulsed Doppler evaluation of both ovaries demonstrates normal
low-resistance arterial and venous waveforms.

Other findings

No adnexal mass or free fluid.
IMPRESSION: Single living intrauterine gestation with assigned gestational age
of 19 weeks 1 day, in breech presentation.

Unremarkable ovaries.  No evidence of ovarian torsion.

No adnexal mass or free fluid.

This exam is performed on an emergent basis and does not
comprehensively evaluate fetal size, dating, or anatomy; follow-up
complete OB US should be considered if further fetal assessment is
warranted.

## 2023-01-11 ENCOUNTER — Telehealth: Payer: Self-pay | Admitting: Gastroenterology

## 2023-01-11 ENCOUNTER — Encounter: Payer: Self-pay | Admitting: Gastroenterology

## 2023-01-11 ENCOUNTER — Ambulatory Visit: Payer: Medicaid Other | Admitting: Gastroenterology

## 2023-01-11 NOTE — Telephone Encounter (Signed)
Patient called to reschedule her appointment for today. I no showed the patient. She states she is sick with a fever.

## 2023-02-22 ENCOUNTER — Ambulatory Visit (INDEPENDENT_AMBULATORY_CARE_PROVIDER_SITE_OTHER): Payer: BLUE CROSS/BLUE SHIELD | Admitting: Dermatology

## 2023-02-22 VITALS — BP 118/73 | HR 106

## 2023-02-22 DIAGNOSIS — L732 Hidradenitis suppurativa: Secondary | ICD-10-CM | POA: Diagnosis not present

## 2023-02-22 DIAGNOSIS — L219 Seborrheic dermatitis, unspecified: Secondary | ICD-10-CM | POA: Diagnosis not present

## 2023-02-22 MED ORDER — KETOCONAZOLE 2 % EX SHAM: MEDICATED_SHAMPOO | CUTANEOUS | 3 refills | Status: DC

## 2023-02-22 MED ORDER — DOXYCYCLINE MONOHYDRATE 100 MG PO CAPS: 100.0000 mg | ORAL_CAPSULE | Freq: Every day | ORAL | 2 refills | Status: DC

## 2023-02-22 MED ORDER — CLINDAMYCIN PHOSPHATE 1 % EX LOTN: TOPICAL_LOTION | Freq: Every day | CUTANEOUS | 2 refills | Status: DC

## 2023-02-22 MED ORDER — FLUOCINONIDE 0.05 % EX SOLN: CUTANEOUS | 2 refills | Status: DC

## 2023-02-22 NOTE — Patient Instructions (Addendum)
Hidradenitis Suppurativa is a chronic; persistent; non-curable, but treatable condition due to abnormal inflamed sweat glands in the body folds (axilla, inframammary, groin, medial thighs), causing recurrent painful draining cysts and scarring. It can be associated with severe scarring acne and cysts; also abscesses and scarring of scalp. The goal is control and prevention of flares, as it is not curable. Scars are permanent and can be thickened. Treatment may include daily use of topical medication and oral antibiotics.  Oral isotretinoin may also be helpful.  For some cases, Humira or Cosentyx (biologic injections) may be prescribed to decrease the inflammatory process and prevent flares.  When indicated, inflamed cysts may also be treated surgically.  Start doxycycline 100 mg take 1 capsule daily with food. Doxycycline should be taken with food to prevent nausea. Do not lay down for 30 minutes after taking. Be cautious with sun exposure and use good sun protection while on this medication. Pregnant women should not take this medication.   Start Clindamycin lotion - apply to affected areas once daily after shower.  Recommend OTC benzoyl peroxide cleanser, wash affected areas daily in shower, let sit several minutes prior to rinsing.  May bleach towels if not rinsed off completely.  Recommended brands include Panoxyl 4% Creamy Wash, CeraVe Acne Foaming Cream wash, or Cetaphil Gentle Clear Complexion-Clearing BPO Acne Cleanser.  Start ketoconazole shampoo- massage into scalp and let sit several minutes before rinsing. May alternate with T-Sal shampoo  Start fluocinonide solution - apply 1-2 drops to itchy areas in scalp once to twice daily until improved.   Due to recent changes in healthcare laws, you may see results of your pathology and/or laboratory studies on MyChart before the doctors have had a chance to review them. We understand that in some cases there may be results that are confusing or  concerning to you. Please understand that not all results are received at the same time and often the doctors may need to interpret multiple results in order to provide you with the best plan of care or course of treatment. Therefore, we ask that you please give [place] 2 business days to thoroughly review all your results before contacting the office for clarification. Should we see a critical lab result, you will be contacted sooner.   If You Need Anything After Your Visit  If you have any questions or concerns for your doctor, please call our main line at [phone] and press option 4 to reach your doctor's medical assistant. If no one answers, please leave a voicemail as directed and we will return your call as soon as possible. Messages left after 4 pm will be answered the following business day.   You may also send [place] a message via MyChart. We typically respond to MyChart messages within 1-2 business days.  For prescription refills, please ask your pharmacy to contact our office. Our fax number is [phone].  If you have an urgent issue when the clinic is closed that cannot wait until the next business day, you can page your doctor at the number below.    Please note that while we do our best to be available for urgent issues outside of office hours, we are not available 24/7.   If you have an urgent issue and are unable to reach [place], you may choose to seek medical care at your doctor's office, retail clinic, urgent care center, or emergency room.  If you have a medical emergency, please immediately call 911 or go to the emergency department.  Pager Numbers  - Dr. Gwen Pounds: [phone]  - Dr. Neale Burly: [phone]  - Dr. Roseanne Reno: [phone]  In the event of inclement weather, please call our main line at [phone] for an update on the status of any delays or closures.  Dermatology Medication Tips: Please keep the boxes that topical medications come in in order to help keep track  of the instructions about where and how to use these. Pharmacies typically print the medication instructions only on the boxes and not directly on the medication tubes.   If your medication is too expensive, please contact our office at [phone] option 4 or send [place] a message through MyChart.   We are unable to tell what your co-pay for medications will be in advance as this is different depending on your insurance coverage. However, we may be able to find a substitute medication at lower cost or fill out paperwork to get insurance to cover a needed medication.   If a prior authorization is required to get your medication covered by your insurance company, please allow [place] 1-2 business days to complete this process.  Drug prices often vary depending on where the prescription is filled and some pharmacies may offer cheaper prices.  The website www.goodrx.com contains coupons for medications through different pharmacies. The prices here do not account for what the cost may be with help from insurance (it may be cheaper with your insurance), but the website can give you the price if you did not use any insurance.  - You can print the associated coupon and take it with your prescription to the pharmacy.  - You may also stop by our office during regular business hours and pick up a GoodRx coupon card.  - If you need your prescription sent electronically to a different pharmacy, notify our office through [facility] or by phone at [phone] option 4.     Si Usted Necesita Algo Despus de Su Visita  Tambin puede enviarnos un mensaje a travs de Clinical cytogeneticist. Por lo general respondemos a los mensajes de MyChart en el transcurso de 1 a 2 das hbiles.  Para renovar recetas, por favor pida a su farmacia que se ponga en contacto con nuestra oficina. Annie Sable de fax es [place] [phone].  Si tiene un asunto urgente cuando la clnica est cerrada y que no puede esperar hasta el siguiente da  hbil, puede llamar/localizar a su doctor(a) al nmero que aparece a continuacin.   Por favor, tenga en cuenta que aunque hacemos todo lo posible para estar disponibles para asuntos urgentes fuera del horario de [place], no estamos disponibles las 24 horas del da, los 7 [address]  [address] de la [place].   Si tiene un problema urgente y no puede comunicarse con nosotros, puede optar por buscar atencin mdica  en el consultorio de su doctor(a), en una clnica privada, en un centro de atencin urgente o en una sala de emergencias.  Si tiene Engineer, drilling, por favor llame inmediatamente al 911 o vaya a la sala de emergencias.  Nmeros de bper  - Dr. Gwen Pounds: [phone]  - Dra. Moye: [phone]  - Dra. Roseanne Reno: [phone]  En caso de inclemencias del [place], por favor llame a Lacy Duverney principal al [phone] para una actualizacin sobre el [place] de cualquier retraso o cierre.  Consejos para la medicacin en dermatologa: Por favor, guarde las cajas en las que vienen los medicamentos de uso tpico para ayudarle a seguir las instrucciones sobre dnde y cmo usarlos. Las farmacias generalmente imprimen  las instrucciones del medicamento slo en las cajas y no directamente en los tubos del [place].   Si su medicamento es muy caro, por favor, pngase en contacto con Rolm Gala llamando al [phone] y presione la opcin 4 o envenos un mensaje a travs de Clinical cytogeneticist.   No podemos decirle cul ser su copago por los medicamentos por adelantado ya que esto es diferente dependiendo de la cobertura de su seguro. Sin embargo, es posible que podamos encontrar un medicamento sustituto a Audiological scientist un formulario para que el seguro cubra el medicamento que se considera necesario.   Si se requiere una autorizacin previa para que su compaa de seguros [place] su medicamento, por favor permtanos de 1 a 2 das hbiles para completar [address]  Los precios de los medicamentos  varan con frecuencia dependiendo del Environmental consultant de dnde se surte la receta y alguna farmacias pueden ofrecer precios ms baratos.  El sitio web www.goodrx.com tiene cupones para medicamentos de Health and safety inspector. Los precios aqu no tienen en cuenta lo que podra costar con la ayuda del seguro (puede ser ms barato con su seguro), pero el sitio web puede darle el precio si no utiliz Tourist information centre manager.  - Puede imprimir el cupn correspondiente y llevarlo con su receta a la farmacia.  - Tambin puede pasar por nuestra oficina durante el horario de atencin regular y Education officer, museum una tarjeta de cupones de GoodRx.  - Si necesita que su receta se enve electrnicamente a una farmacia diferente, informe a nuestra oficina a travs de MyChart de [place] o por telfono llamando al [phone] y presione la opcin 4.

## 2023-02-22 NOTE — Progress Notes (Signed)
New Patient Visit   Subjective  Kara House is a 22 y.o. female who presents for the following: bumps on the inner thighs and axilla x 1 year. Patient has not been on any treatment in the past. She is flared now on the inner thighs.   She also has scaly, itchy scalp for years. She has tried multiple medicated shampoos with no relief.   Referral from Joseph Berkshire, MD.  The following portions of the chart were reviewed this encounter and updated as appropriate: medications, allergies, medical history  Review of Systems:  No other skin or systemic complaints except as noted in HPI or Assessment and Plan.  Objective  Well appearing patient in no apparent distress; mood and affect are within normal limits.  A focused examination was performed of the following areas:  Legs, axilla, trunk, scalp  Relevant exam findings are noted in the Assessment and Plan.    Assessment & Plan     HIDRADENITIS SUPPURATIVA Exam: scattered inflammatory papules on the thighs; violaceous and white depressed macules c/w scar on the right flank and bilateral axilla.  Chronic and persistent condition with duration or expected duration over one year. Condition is bothersome/symptomatic for patient. Currently flared.   Hidradenitis Suppurativa is a chronic; persistent; non-curable, but treatable condition due to abnormal inflamed sweat glands in the body folds (axilla, inframammary, groin, medial thighs), causing recurrent painful draining cysts and scarring. It can be associated with severe scarring acne and cysts; also abscesses and scarring of scalp. The goal is control and prevention of flares, as it is not curable. Scars are permanent and can be thickened. Treatment may include daily use of topical medication and oral antibiotics.  Oral isotretinoin may also be helpful.  For some cases, Humira or Cosentyx (biologic injections) may be prescribed to decrease the inflammatory process and prevent flares.   When indicated, inflamed cysts may also be treated surgically.  Treatment Plan: Start doxycyline 100 MG take 1 po QD with food dsp #30 2Rf. Doxycycline should be taken with food to prevent nausea. Do not lay down for 30 minutes after taking. Be cautious with sun exposure and use good sun protection while on this medication. Pregnant women should not take this medication.   Start Clindamycin lotion Apply to AA QD after shower dsp 60mL 2Rf.  Recommend OTC benzoyl peroxide cleanser, wash affected areas daily in shower, let sit several minutes prior to rinsing.  May bleach towels if not rinsed off completely.  Recommended brands include Panoxyl 4% Creamy Wash, CeraVe Acne Foaming Cream wash, or Cetaphil Gentle Clear Complexion-Clearing BPO Acne Cleanser.  SEBORRHEIC DERMATITIS Exam: diffuse pink patches with greasy scale at scalp  Chronic and persistent condition with duration or expected duration over one year. Condition is bothersome/symptomatic for patient. Currently flared.   Seborrheic Dermatitis is a chronic persistent rash characterized by pinkness and scaling most commonly of the mid face but also can occur on the scalp (dandruff), ears; mid chest, mid back and groin.  It tends to be exacerbated by stress and cooler weather.  People who have neurologic disease may experience new onset or exacerbation of existing seborrheic dermatitis.  The condition is not curable but treatable and can be controlled.  Treatment Plan: Start ketoconazole 2% shampoo Massage into scalp qd, let sit several minutes before rinsing  Start Lidex scalp solution qd/bid prn itch  T-Sal and Head and Shoulders samples given to patient. May alternate with Ketoconazole shampoo.   Return in about 2 months (around  04/24/2023) for HS, [facility].  ICherlyn Labella, CMA, am acting as scribe for Willeen Niece, MD .   Documentation: I have reviewed the above documentation for accuracy and completeness, and I agree with the  above.  Willeen Niece, MD

## 2023-03-24 NOTE — Progress Notes (Signed)
Gastroenterology Consultation  Referring Provider:     Jerrol Banana, MD Primary Care Physician:  Jerrol Banana, MD Primary Gastroenterologist:  Dr. Servando Snare     Reason for Consultation:     GERD        HPI:   Kara House is a 22 y.o. y/o female referred for consultation & management of GERD by Dr. Ashley Royalty, Ocie Bob, MD. This patient comes in today after being seen by her primary care provider back in November 2023.  At that time the patient was reporting that she was having chronic GERD with mild improvement with dietary modifications and still having symptoms despite trying pantoprazole.  Back in January the patient was noted to have a hemoglobin of 11 with a MCV of 75.  The patient's ferritin was 4 and her iron saturation was 6.  Patient has also been noted to have increased alkaline phosphatase with her labs showing:  Component     Latest Ref Rng 06/19/2019 12/21/2020 04/03/2021 06/29/2021 10/29/2022  Alkaline Phosphatase     38 - 126 U/L 152 (H)  160 (H)  160 (H)  119  122 (H)   Total Bilirubin     0.3 - 1.2 mg/dL 0.7  0.6  0.7  0.7  0.3    Component     Latest Ref Rng 11/07/2022  Alkaline Phosphatase     38 - 126 U/L 102   Total Bilirubin     0.3 - 1.2 mg/dL 0.6    The patient's AST and ALT have been normal during this time. She has a mom with acid reflux and she has heavy periods as the cause of her blood loss. The patient reports that her epigastric discomfort is usually right after she lays down and goes to sleep and usually happens 3 times a month.  She associates it with greasy foods and spicy foods.  There is no report of any nausea or vomiting associated with this or hematemesis.   No past medical history on file.  Past Surgical History:  Procedure Laterality Date   PARATHYROIDECTOMY  11/2021   TONSILLECTOMY      Prior to Admission medications   Medication Sig Start Date End Date Taking? Authorizing Provider  clindamycin (CLEOCIN-T) 1 % lotion Apply topically  daily. After shower. 02/22/23 02/22/24  Willeen Niece, MD  doxycycline (MONODOX) 100 MG capsule Take 1 capsule (100 mg total) by mouth daily. Take with food. 02/22/23   Willeen Niece, MD  fluocinonide (LIDEX) 0.05 % external solution Apply 1-2 drops to affected areas scalp once to twice daily for itching. 02/22/23   Willeen Niece, MD  ketoconazole (NIZORAL) 2 % shampoo Massage into scalp every day, let sit several minutes before rinsing. 02/22/23   Willeen Niece, MD  pantoprazole (PROTONIX) 40 MG tablet Take 1 tablet (40 mg total) by mouth daily as needed. 30+ minutes prior to dinner on empty stomach. Patient not taking: Reported on 02/22/2023 10/29/22   Jerrol Banana, MD    No family history on file.   Social History   Tobacco Use   Smoking status: Never   Smokeless tobacco: Never  Vaping Use   Vaping Use: Never used  Substance Use Topics   Alcohol use: Yes   Drug use: Never    Allergies as of 03/25/2023   (No Known Allergies)    Review of Systems:    All systems reviewed and negative except where noted in HPI.   Physical Exam:  There were  no vitals taken for this visit. No LMP recorded. General:   Alert,  Well-developed, well-nourished, pleasant and cooperative in NAD Head:  Normocephalic and atraumatic. Eyes:  Sclera clear, no icterus.   Conjunctiva pink. Ears:  Normal auditory acuity. Neck:  Supple; no masses or thyromegaly. Lungs:  Respirations even and unlabored.  Clear throughout to auscultation.   No wheezes, crackles, or rhonchi. No acute distress. Heart:  Regular rate and rhythm; no murmurs, clicks, rubs, or gallops. Abdomen:  Normal bowel sounds.  No bruits.  Soft, non-tender and non-distended without masses, hepatosplenomegaly or hernias noted.  No guarding or rebound tenderness.  Negative Carnett sign.   Rectal:  Deferred.  Pulses:  Normal pulses noted. Extremities:  No clubbing or edema.  No cyanosis. Neurologic:  Alert and oriented x3;  grossly normal  neurologically. Skin:  Intact without significant lesions or rashes.  No jaundice. Lymph Nodes:  No significant cervical adenopathy. Psych:  Alert and cooperative. Normal mood and affect.  Imaging Studies: No results found.  Assessment and Plan:   Kara House is a 22 y.o. y/o female who comes in with nocturnal GERD like symptoms.  The patient was taking pantoprazole daily and states that she did not notice much of a difference but had reported that her symptoms were only 3 times a month on average.  The patient does have some relief when she takes Tums in the middle the night.  The patient has been given information on lifestyle modifications including not eating for 3 to 4 hours before she goes to sleep and to take Tums or her PPI on nights that she knows she has indulged more than other times.  The patient has no worry symptoms therefore would not be set up for any endoscopic procedures.  The patient has been explained the plan and agrees with it    Midge Minium, MD. Clementeen Graham    Note: This dictation was prepared with Dragon dictation along with smaller phrase technology. Any transcriptional errors that result from this process are unintentional.

## 2023-03-25 ENCOUNTER — Encounter: Payer: Self-pay | Admitting: Gastroenterology

## 2023-03-25 ENCOUNTER — Ambulatory Visit (INDEPENDENT_AMBULATORY_CARE_PROVIDER_SITE_OTHER): Payer: BLUE CROSS/BLUE SHIELD | Admitting: Gastroenterology

## 2023-03-25 VITALS — BP 110/75 | HR 101 | Temp 98.4°F | Ht 63.0 in | Wt 236.0 lb

## 2023-03-25 DIAGNOSIS — K219 Gastro-esophageal reflux disease without esophagitis: Secondary | ICD-10-CM | POA: Diagnosis not present

## 2023-04-24 ENCOUNTER — Other Ambulatory Visit: Payer: Self-pay

## 2023-04-24 ENCOUNTER — Encounter: Payer: Self-pay | Admitting: Emergency Medicine

## 2023-04-24 ENCOUNTER — Emergency Department
Admission: EM | Admit: 2023-04-24 | Discharge: 2023-04-25 | Disposition: A | Discharge status: Home / Self Care | Payer: BLUE CROSS/BLUE SHIELD | Attending: Emergency Medicine | Admitting: Emergency Medicine

## 2023-04-24 ENCOUNTER — Emergency Department: Payer: BLUE CROSS/BLUE SHIELD

## 2023-04-24 DIAGNOSIS — R1013 Epigastric pain: Secondary | ICD-10-CM | POA: Diagnosis present

## 2023-04-24 DIAGNOSIS — K2970 Gastritis, unspecified, without bleeding: Secondary | ICD-10-CM | POA: Insufficient documentation

## 2023-04-24 LAB — URINALYSIS, ROUTINE W REFLEX MICROSCOPIC
Bilirubin Urine: NEGATIVE
Glucose, UA: NEGATIVE mg/dL
Hgb urine dipstick: NEGATIVE
Ketones, ur: NEGATIVE mg/dL
Nitrite: NEGATIVE
Protein, ur: NEGATIVE mg/dL
Specific Gravity, Urine: 1.012 (ref 1.005–1.030)
pH: 7 (ref 5.0–8.0)

## 2023-04-24 LAB — COMPREHENSIVE METABOLIC PANEL
ALT: 21 U/L (ref 0–44)
AST: 20 U/L (ref 15–41)
Albumin: 4.8 g/dL (ref 3.5–5.0)
Alkaline Phosphatase: 114 U/L (ref 38–126)
Anion gap: 10 (ref 5–15)
BUN: 12 mg/dL (ref 6–20)
CO2: 22 mmol/L (ref 22–32)
Calcium: 9.1 mg/dL (ref 8.9–10.3)
Chloride: 104 mmol/L (ref 98–111)
Creatinine, Ser: 0.52 mg/dL (ref 0.44–1.00)
GFR, Estimated: >60 mL/min (ref >60)
Glucose, Bld: 108 mg/dL — ABNORMAL HIGH (ref 70–99)
Potassium: 3.5 mmol/L (ref 3.5–5.1)
Sodium: 136 mmol/L (ref 135–145)
Total Bilirubin: 0.5 mg/dL (ref 0.3–1.2)
Total Protein: 8.8 g/dL — ABNORMAL HIGH (ref 6.5–8.1)

## 2023-04-24 LAB — CBC
HCT: 40.2 % (ref 36.0–46.0)
Hemoglobin: 11.6 g/dL — ABNORMAL LOW (ref 12.0–15.0)
MCH: 22.2 pg — ABNORMAL LOW (ref 26.0–34.0)
MCHC: 28.9 g/dL — ABNORMAL LOW (ref 30.0–36.0)
MCV: 76.9 fL — ABNORMAL LOW (ref 80.0–100.0)
Platelets: 473 10*3/uL — ABNORMAL HIGH (ref 150–400)
RBC: 5.23 MIL/uL — ABNORMAL HIGH (ref 3.87–5.11)
RDW: 14.6 % (ref 11.5–15.5)
WBC: 7.2 10*3/uL (ref 4.0–10.5)
nRBC: 0 % (ref 0.0–0.2)

## 2023-04-24 LAB — PREGNANCY, URINE: Preg Test, Ur: NEGATIVE

## 2023-04-24 LAB — LIPASE, BLOOD: Lipase: 36 U/L (ref 11–51)

## 2023-04-24 NOTE — ED Triage Notes (Signed)
Patient c/o upper abdominal pain x 2 hours.  Patient denies n/v/d.

## 2023-04-25 DIAGNOSIS — K2970 Gastritis, unspecified, without bleeding: Secondary | ICD-10-CM | POA: Diagnosis not present

## 2023-04-25 MED ORDER — SUCRALFATE 1 G PO TABS: 1.0000 g | ORAL_TABLET | Freq: Four times a day (QID) | ORAL | 0 refills | Status: DC

## 2023-04-25 MED ORDER — LIDOCAINE VISCOUS HCL 2 % MT SOLN
15.0000 mL | Freq: Once | OROMUCOSAL | Status: AC
  Administered 2023-04-25: 15 mL via ORAL
  Filled 2023-04-25: qty 15

## 2023-04-25 MED ORDER — ALUM & MAG HYDROXIDE-SIMETH 200-200-20 MG/5ML PO SUSP
30.0000 mL | Freq: Once | ORAL | Status: AC
  Administered 2023-04-25: 30 mL via ORAL
  Filled 2023-04-25: qty 30

## 2023-04-25 NOTE — Discharge Instructions (Signed)
Please seek medical attention for any high fevers, chest pain, shortness of breath, change in behavior, persistent vomiting, bloody stool or any other new or concerning symptoms.  

## 2023-04-25 NOTE — ED Provider Notes (Signed)
[facility] Provider Note    Event Date/Time   First MD Initiated Contact with Patient 04/24/23 2359     (approximate)   History   Abdominal Pain   HPI  Kara House is a 22 y.o. female  who presents to the emergency department today because of concern for epigastric pain. Started last night, has persisted. She states this pain happens to her occasionally. Has been told it was acid reflux in the past. Tried a tums tonight without any relief. No shortness of breath.     Physical Exam   Triage Vital Signs: ED Triage Vitals  Encounter Vitals Group     BP 04/24/23 2112 134/85     Systolic BP Percentile --      Diastolic BP Percentile --      Pulse Rate 04/24/23 2112 90     Resp 04/24/23 2112 18     Temp 04/24/23 2110 98.6 F (37 C)     Temp Source 04/24/23 2110 Oral     SpO2 04/24/23 2112 100 %     Weight 04/24/23 2111 230 lb (104.3 kg)     Height 04/24/23 2111 5\' 2"  (1.575 m)     Head Circumference --      Peak Flow --      Pain Score 04/24/23 2111 8     Pain Loc --      Pain Education --      Exclude from Growth Chart --     Most recent vital signs: Vitals:   04/24/23 2110 04/24/23 2112  BP:  134/85  Pulse:  90  Resp:  18  Temp: 98.6 F (37 C)   SpO2:  100%   General: Awake, alert, oriented. CV:  Good peripheral perfusion. Regular rate and rhythm. Resp:  Normal effort. Lungs clear. Abd:  No distention. Minimally tender in the epigastric region.   ED Results / Procedures / Treatments   Labs (all labs ordered are listed, but only abnormal results are displayed) Labs Reviewed  COMPREHENSIVE METABOLIC PANEL - Abnormal; Notable for the following components:      Result Value   Glucose, Bld 108 (*)    Total Protein 8.8 (*)    All other components within normal limits  CBC - Abnormal; Notable for the following components:   RBC 5.23 (*)    Hemoglobin 11.6 (*)    MCV 76.9 (*)    MCH 22.2 (*)    MCHC 28.9 (*)    Platelets  473 (*)    All other components within normal limits  URINALYSIS, ROUTINE W REFLEX MICROSCOPIC - Abnormal; Notable for the following components:   Color, Urine YELLOW (*)    APPearance HAZY (*)    Leukocytes,Ua LARGE (*)    Bacteria, UA RARE (*)    All other components within normal limits  LIPASE, BLOOD  PREGNANCY, URINE     EKG  None   RADIOLOGY I independently interpreted and visualized the RUQ [place]. My interpretation: No gallstones. No evidence of cholecystitis Radiology interpretation:  IMPRESSION:  1. No evidence of acute cholecystitis.     PROCEDURES:  Critical Care performed: No    MEDICATIONS ORDERED IN ED: Medications - No data to display   IMPRESSION / MDM / ASSESSMENT AND PLAN / ED COURSE  I reviewed the triage vital signs and the nursing notes.  Differential diagnosis includes, but is not limited to, gallbladder disease, pancreatitis, gerd  Patient's presentation is most consistent with acute presentation with potential threat to life or bodily function.   Patient presented to the emergency department today with concern for epigastric pain.  On exam patient is minimally tender in this area.  Blood work without concerning leukocytosis, elevated FTs or lipase.  Right upper quadrant ultrasound was performed which did not show gallstones.  At this time I think likely patient's symptoms secondary to GERD.  Discussed this with the patient. Patient was given gi cocktail and felt better. Will plan on discharging.      FINAL CLINICAL IMPRESSION(S) / ED DIAGNOSES   Final diagnoses:  Gastritis without bleeding, unspecified chronicity, unspecified gastritis type     Note:  This document was prepared using Dragon voice recognition software and may include unintentional dictation errors.    Phineas Semen, MD 04/25/23 [phone]

## 2023-04-25 NOTE — ED Provider Notes (Incomplete)
[facility] Provider Note    Event Date/Time   First MD Initiated Contact with Patient 04/24/23 2359     (approximate)   History   Abdominal Pain   HPI {Remember to add pertinent medical, surgical, social, and/or OB history to HPI:1} Kara House is a 22 y.o. female  ***       Physical Exam   Triage Vital Signs: ED Triage Vitals  Encounter Vitals Group     BP 04/24/23 2112 134/85     Systolic BP Percentile --      Diastolic BP Percentile --      Pulse Rate 04/24/23 2112 90     Resp 04/24/23 2112 18     Temp 04/24/23 2110 98.6 F (37 C)     Temp Source 04/24/23 2110 Oral     SpO2 04/24/23 2112 100 %     Weight 04/24/23 2111 230 lb (104.3 kg)     Height 04/24/23 2111 5\' 2"  (1.575 m)     Head Circumference --      Peak Flow --      Pain Score 04/24/23 2111 8     Pain Loc --      Pain Education --      Exclude from Growth Chart --     Most recent vital signs: Vitals:   04/24/23 2110 04/24/23 2112  BP:  134/85  Pulse:  90  Resp:  18  Temp: 98.6 F (37 C)   SpO2:  100%    {Only need to document appropriate and relevant physical exam:1} General: Awake, no distress. *** CV:  Good peripheral perfusion. *** Resp:  Normal effort. *** Abd:  No distention. *** Other:  ***   ED Results / Procedures / Treatments   Labs (all labs ordered are listed, but only abnormal results are displayed) Labs Reviewed  COMPREHENSIVE METABOLIC PANEL - Abnormal; Notable for the following components:      Result Value   Glucose, Bld 108 (*)    Total Protein 8.8 (*)    All other components within normal limits  CBC - Abnormal; Notable for the following components:   RBC 5.23 (*)    Hemoglobin 11.6 (*)    MCV 76.9 (*)    MCH 22.2 (*)    MCHC 28.9 (*)    Platelets 473 (*)    All other components within normal limits  URINALYSIS, ROUTINE W REFLEX MICROSCOPIC - Abnormal; Notable for the following components:   Color, Urine YELLOW (*)     APPearance HAZY (*)    Leukocytes,Ua LARGE (*)    Bacteria, UA RARE (*)    All other components within normal limits  LIPASE, BLOOD  PREGNANCY, URINE     EKG  ***   RADIOLOGY I independently interpreted and visualized the RUQ [place]. My interpretation: No gallstones. No evidence of cholecystitis Radiology interpretation: ***   PROCEDURES:  Critical Care performed: Yes  CRITICAL CARE Performed by: Phineas Semen   Total critical care time: *** minutes  Critical care time was exclusive of separately billable procedures and treating other patients.  Critical care was necessary to treat or prevent imminent or life-threatening deterioration.  Critical care was time spent personally by me on the following activities: development of treatment plan with patient and/or surrogate as well as nursing, discussions with consultants, evaluation of patient's response to treatment, examination of patient, obtaining history from patient or surrogate, ordering and performing treatments and interventions, ordering and review of laboratory  studies, ordering and review of radiographic studies, pulse oximetry and re-evaluation of patient's condition.   Procedures    MEDICATIONS ORDERED IN ED: Medications - No data to display   IMPRESSION / MDM / ASSESSMENT AND PLAN / ED COURSE  I reviewed the triage vital signs and the nursing notes.                              Differential diagnosis includes, but is not limited to, ***  Patient's presentation is most consistent with {EM COPA:27473}   ***The patient is on the cardiac monitor to evaluate for evidence of arrhythmia and/or significant heart rate changes.  ***      FINAL CLINICAL IMPRESSION(S) / ED DIAGNOSES   Final diagnoses:  None     Rx / DC Orders   ED Discharge Orders     None        Note:  This document was prepared using Dragon voice recognition software and may include unintentional dictation errors.

## 2023-04-26 ENCOUNTER — Ambulatory Visit (INDEPENDENT_AMBULATORY_CARE_PROVIDER_SITE_OTHER): Payer: BLUE CROSS/BLUE SHIELD | Admitting: Dermatology

## 2023-04-26 VITALS — BP 118/71 | HR 98

## 2023-04-26 DIAGNOSIS — L219 Seborrheic dermatitis, unspecified: Secondary | ICD-10-CM | POA: Diagnosis not present

## 2023-04-26 DIAGNOSIS — L700 Acne vulgaris: Secondary | ICD-10-CM | POA: Diagnosis not present

## 2023-04-26 DIAGNOSIS — L732 Hidradenitis suppurativa: Secondary | ICD-10-CM

## 2023-04-26 DIAGNOSIS — L858 Other specified epidermal thickening: Secondary | ICD-10-CM

## 2023-04-26 MED ORDER — CLINDAMYCIN PHOSPHATE 1 % EX LOTN: TOPICAL_LOTION | Freq: Every day | CUTANEOUS | 5 refills | Status: DC

## 2023-04-26 MED ORDER — KETOCONAZOLE 2 % EX SHAM: 1.0000 | MEDICATED_SHAMPOO | Freq: Once | CUTANEOUS | 5 refills | Status: AC

## 2023-04-26 MED ORDER — DIFFERIN 0.3 % EX GEL: 1.0000 | Freq: Every day | CUTANEOUS | 4 refills | Status: DC

## 2023-04-26 NOTE — Progress Notes (Signed)
Follow-Up Visit   Subjective  Kara House is a 22 y.o. female who presents for the following: 2 month follow-up. Patient with hidradenitis suppurativa of the thighs, flanks, and axilla. She was not able to take doxycycline while taking iron and calcium supplements. She is using benzoyl peroxide wash in the shower and then applies clindamycin lotion. Patient has seborrheic dermatitis of the scalp, improving with Lidex scalp solution prn and ketoconazole shampoo 1-2x/wk alternating with Head and Shoulders. She also has dryness of the upper arms she would like checked.   The following portions of the chart were reviewed this encounter and updated as appropriate: medications, allergies, medical history  Review of Systems:  No other skin or systemic complaints except as noted in HPI or Assessment and Plan.  Objective  Well appearing patient in no apparent distress; mood and affect are within normal limits.  A focused examination was performed of the following areas: Scalp, trunk Relevant physical exam findings are noted in the Assessment and Plan.    Assessment & Plan  HIDRADENITIS SUPPURATIVA Exam: Scarring of the bilateral axilla; no active lesions  Chronic and persistent condition with duration or expected duration over one year. Condition is symptomatic/ bothersome to patient. Not currently at goal, but improving.   Hidradenitis Suppurativa is a chronic; persistent; non-curable, but treatable condition due to abnormal inflamed sweat glands in the body folds (axilla, inframammary, groin, medial thighs), causing recurrent painful draining cysts and scarring. It can be associated with severe scarring acne and cysts; also abscesses and scarring of scalp. The goal is control and prevention of flares, as it is not curable. Scars are permanent and can be thickened. Treatment may include daily use of topical medication and oral antibiotics.  Oral isotretinoin may also be helpful.  For some  cases, Humira or Cosentyx (biologic injections) may be prescribed to decrease the inflammatory process and prevent flares.  When indicated, inflamed cysts may also be treated surgically.  Treatment Plan: Continue Clindamycin lotion Apply to AA QD after shower. If flares, may start doxycycline 100 MG 1 po every day with food. Take at a different time of calcium and iron supplements.   Continue OTC benzoyl peroxide cleanser (CeraVe), wash affected areas daily in shower, let sit several minutes prior to rinsing.  May bleach towels if not rinsed off completely.  Recommended brands include Panoxyl 4% Creamy Wash, CeraVe Acne Foaming Cream wash, or Cetaphil Gentle Clear Complexion-Clearing BPO Acne Cleanser.  SEBORRHEIC DERMATITIS Exam: Scalp Clear  Chronic condition with duration or expected duration over one year. Currently well-controlled.   Seborrheic Dermatitis is a chronic persistent rash characterized by pinkness and scaling most commonly of the mid face but also can occur on the scalp (dandruff), ears; mid chest, mid back and groin.  It tends to be exacerbated by stress and cooler weather.  People who have neurologic disease may experience new onset or exacerbation of existing seborrheic dermatitis.  The condition is not curable but treatable and can be controlled.  Treatment Plan: Continue  ketoconazole 2% shampoo Massage into scalp qd, let sit several minutes before rinsing. Alternate with Head & Shoulders shampoo.    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 Genetic in nature. No cure. - Observe. - If desired, patient can use an emollient (moisturizer) containing ammonium lactate (AmLactin), urea or salicylic acid once a day to smooth the area  Recommend starting moisturizer with exfoliant (  Urea, Salicylic acid, or Lactic acid) one to two times daily to help smooth rough and bumpy skin.  OTC  options include Cetaphil Rough and Bumpy lotion (Urea), Eucerin Roughness Relief lotion or spot treatment cream (Urea), CeraVe SA lotion/cream for Rough and Bumpy skin (Sal Acid), Gold Bond Rough and Bumpy cream (Sal Acid), and AmLactin 12% lotion/cream (Lactic Acid).  If applying in morning, also apply sunscreen to sun-exposed areas, since these exfoliating moisturizers can increase sensitivity to sun. Sample of CeraVe Psoriasis and Renewing cream given.  ACNE VULGARIS Exam: erythema on medial cheeks with scattered open and closed comedones on cheeks and chin.  Chronic and persistent condition with duration or expected duration over one year. Condition is bothersome/symptomatic for patient. Currently flared.   Treatment Plan: Start Differin 0.3% Gel Apply to face at bedtime as tolerated dsp 45g 3Rf. May use moisturizer if drying.  Topical retinoid medications like tretinoin/Retin-A, adapalene/Differin, tazarotene/Fabior, and Epiduo/Epiduo Forte can cause dryness and irritation when first started. Only apply a pea-sized amount to the entire affected area. Avoid applying it around the eyes, edges of mouth and creases at the nose. If you experience irritation, use a good moisturizer first and/or apply the medicine less often. If you are doing well with the medicine, you can increase how often you use it until you are applying every night. Be careful with sun protection while using this medication as it can make you sensitive to the sun. This medicine should not be used by pregnant women.   Start benzoyl peroxide wash (CeraVe) in shower to face as tolerated.  Benzoyl peroxide can cause dryness and irritation of the skin. It can also bleach fabric. When used together with Aczone (dapsone) cream, it can stain the skin orange.  May also use clindamycin lotion to face every day prn flares    Return in about 6 months (around 10/27/2023) for Acne, HS, Seb Derm.  ICherlyn Labella, CMA, am acting as scribe  for Willeen Niece, MD .   Documentation: I have reviewed the above documentation for accuracy and completeness, and I agree with the above.  Willeen Niece, MD

## 2023-04-26 NOTE — Patient Instructions (Addendum)
Recommend starting moisturizer with exfoliant (Urea, Salicylic acid, or Lactic acid) one to two times daily to help smooth rough and bumpy skin.  OTC options include Cetaphil Rough and Bumpy lotion (Urea), Eucerin Roughness Relief lotion or spot treatment cream (Urea), CeraVe SA lotion/cream for Rough and Bumpy skin (Sal Acid), Gold Bond Rough and Bumpy cream (Sal Acid), and AmLactin 12% lotion/cream (Lactic Acid).  If applying in morning, also apply sunscreen to sun-exposed areas, since these exfoliating moisturizers can increase sensitivity to sun.   Hidradenitis Suppurativa is a chronic; persistent; non-curable, but treatable condition due to abnormal inflamed sweat glands in the body folds (axilla, inframammary, groin, medial thighs), causing recurrent painful draining cysts and scarring. It can be associated with severe scarring acne and cysts; also abscesses and scarring of scalp. The goal is control and prevention of flares, as it is not curable. Scars are permanent and can be thickened. Treatment may include daily use of topical medication and oral antibiotics.  Oral isotretinoin may also be helpful.  For some cases, Humira or Cosentyx (biologic injections) may be prescribed to decrease the inflammatory process and prevent flares.  When indicated, inflamed cysts may also be treated surgically.  If Hidradenitis flares, start doxycycline 1 capsule by mouth daily with food x 2 weeks. Take at a different time of calcium and iron supplements.   Due to recent changes in healthcare laws, you may see results of your pathology and/or laboratory studies on MyChart before the doctors have had a chance to review them. We understand that in some cases there may be results that are confusing or concerning to you. Please understand that not all results are received at the same time and often the doctors may need to interpret multiple results in order to provide you with the best plan of care or course of treatment.  Therefore, we ask that you please give [place] 2 business days to thoroughly review all your results before contacting the office for clarification. Should we see a critical lab result, you will be contacted sooner.   If You Need Anything After Your Visit  If you have any questions or concerns for your doctor, please call our main line at [phone] and press option 4 to reach your doctor's medical assistant. If no one answers, please leave a voicemail as directed and we will return your call as soon as possible. Messages left after 4 pm will be answered the following business day.   You may also send [place] a message via MyChart. We typically respond to MyChart messages within 1-2 business days.  For prescription refills, please ask your pharmacy to contact our office. Our fax number is [phone].  If you have an urgent issue when the clinic is closed that cannot wait until the next business day, you can page your doctor at the number below.    Please note that while we do our best to be available for urgent issues outside of office hours, we are not available 24/7.   If you have an urgent issue and are unable to reach [place], you may choose to seek medical care at your doctor's office, retail clinic, urgent care center, or emergency room.  If you have a medical emergency, please immediately call 911 or go to the emergency department.  Pager Numbers  - Dr. Gwen Pounds: [phone]  - Dr. Neale Burly: [phone]  - Dr. Roseanne Reno: [phone]  In the event of inclement weather, please call our main line at [phone] for an update on the  status of any delays or closures.  Dermatology Medication Tips: Please keep the boxes that topical medications come in in order to help keep track of the instructions about where and how to use these. Pharmacies typically print the medication instructions only on the boxes and not directly on the medication tubes.   If your medication is too expensive, please  contact our office at [phone] option 4 or send [place] a message through MyChart.   We are unable to tell what your co-pay for medications will be in advance as this is different depending on your insurance coverage. However, we may be able to find a substitute medication at lower cost or fill out paperwork to get insurance to cover a needed medication.   If a prior authorization is required to get your medication covered by your insurance company, please allow [place] 1-2 business days to complete this process.  Drug prices often vary depending on where the prescription is filled and some pharmacies may offer cheaper prices.  The website www.goodrx.com contains coupons for medications through different pharmacies. The prices here do not account for what the cost may be with help from insurance (it may be cheaper with your insurance), but the website can give you the price if you did not use any insurance.  - You can print the associated coupon and take it with your prescription to the pharmacy.  - You may also stop by our office during regular business hours and pick up a GoodRx coupon card.  - If you need your prescription sent electronically to a different pharmacy, notify our office through [facility] or by phone at [phone] option 4.     Si Usted Necesita Algo Despus de Su Visita  Tambin puede enviarnos un mensaje a travs de Clinical cytogeneticist. Por lo general respondemos a los mensajes de MyChart en el transcurso de 1 a 2 das hbiles.  Para renovar recetas, por favor pida a su farmacia que se ponga en contacto con nuestra oficina. Annie Sable de fax es [place] [phone].  Si tiene un asunto urgente cuando la clnica est cerrada y que no puede esperar hasta el siguiente da hbil, puede llamar/localizar a su doctor(a) al nmero que aparece a continuacin.   Por favor, tenga en cuenta que aunque hacemos todo lo posible para estar disponibles para asuntos urgentes fuera del horario de  [place], no estamos disponibles las 24 horas del da, los 7 [address]  [address] de la [place].   Si tiene un problema urgente y no puede comunicarse con nosotros, puede optar por buscar atencin mdica  en el consultorio de su doctor(a), en una clnica privada, en un centro de atencin urgente o en una sala de emergencias.  Si tiene Engineer, drilling, por favor llame inmediatamente al 911 o vaya a la sala de emergencias.  Nmeros de bper  - Dr. Gwen Pounds: [phone]  - Dra. Moye: [phone]  - Dra. Roseanne Reno: [phone]  En caso de inclemencias del [place], por favor llame a Lacy Duverney principal al [phone] para una actualizacin sobre el [place] de cualquier retraso o cierre.  Consejos para la medicacin en dermatologa: Por favor, guarde las cajas en las que vienen los medicamentos de uso tpico para ayudarle a seguir las instrucciones sobre dnde y cmo usarlos. Las farmacias generalmente imprimen las instrucciones del medicamento slo en las cajas y no directamente en los tubos del [place].   Si su medicamento es muy caro, por favor, pngase en contacto con Rolm Gala llamando al [phone] y  presione la opcin 4 o envenos un mensaje a travs de MyChart.   No podemos decirle cul ser su copago por los medicamentos por adelantado ya que esto es diferente dependiendo de la cobertura de su seguro. Sin embargo, es posible que podamos encontrar un medicamento sustituto a Audiological scientist un formulario para que el seguro cubra el medicamento que se considera necesario.   Si se requiere una autorizacin previa para que su compaa de seguros [place] su medicamento, por favor permtanos de 1 a 2 das hbiles para completar [address]  Los precios de los medicamentos varan con frecuencia dependiendo del Environmental consultant de dnde se surte la receta y alguna farmacias pueden ofrecer precios ms baratos.  El sitio web www.goodrx.com tiene cupones para medicamentos de Health and safety inspector. Los  precios aqu no tienen en cuenta lo que podra costar con la ayuda del seguro (puede ser ms barato con su seguro), pero el sitio web puede darle el precio si no utiliz Tourist information centre manager.  - Puede imprimir el cupn correspondiente y llevarlo con su receta a la farmacia.  - Tambin puede pasar por nuestra oficina durante el horario de atencin regular y Education officer, museum una tarjeta de cupones de GoodRx.  - Si necesita que su receta se enve electrnicamente a una farmacia diferente, informe a nuestra oficina a travs de MyChart de [place] o por telfono llamando al [phone] y presione la opcin 4.

## 2023-04-27 ENCOUNTER — Telehealth: Payer: Self-pay | Admitting: *Deleted

## 2023-04-27 NOTE — Transitions of Care (Post Inpatient/ED Visit) (Signed)
   04/27/2023  Name: Kara House MRN: [id number] DOB: October 11, 2001  Today's TOC FU Call Status: Today's TOC FU Call Status:: Successful TOC FU Call Competed TOC FU Call Complete Date: 04/27/23  Transition Care Management Follow-up Telephone Call Date of Discharge: 04/24/23 Discharge Facility: [facility]) Type of Discharge: Emergency Department Reason for ED Visit: Other: (abdominal pain) How have you been since you were released from the hospital?: Better Any questions or concerns?: No  Items Reviewed: Did you receive and understand the discharge instructions provided?: Yes Dietary orders reviewed?: NA Do you have support at home?: Yes People in Home: spouse, child(ren), dependent Name of Support/Comfort Primary Source: husband/Miguel  Medications Reviewed Today: Medications Reviewed Today     Reviewed by Heidi Dach, RN (Registered Nurse) on 04/27/23 at 1337  Med List Status: <None>   Medication Order Taking? Sig Documenting Provider Last Dose Status Informant  calcium carbonate (TUMS - DOSED IN MG ELEMENTAL CALCIUM) 500 MG chewable tablet [id number] Yes Chew 1 tablet by mouth daily. [provider] Taking Active   clindamycin (CLEOCIN-T) 1 % lotion [id number] Yes Apply topically daily. Willeen Niece, MD Taking Active   DIFFERIN 0.3 % gel [id number] Yes Apply 1 Application topically at bedtime. Willeen Niece, MD Taking Active   ferrous sulfate 325 (65 FE) MG EC tablet [id number] Yes Take 325 mg by mouth 3 (three) times daily with meals. [provider] Taking Active   pantoprazole (PROTONIX) 40 MG tablet [id number] No Take 1 tablet (40 mg total) by mouth daily as needed. 30+ minutes prior to dinner on empty stomach.  Patient not taking: Reported on 04/27/2023   Jerrol Banana, MD Not Taking Active            Med Note Ardelia Mems, Emmanuell Kantz A   Tue Apr 27, 2023  1:36 PM) Planning to pick up today  sucralfate (CARAFATE) 1 g tablet [id number]  Yes Take 1 tablet (1 g total) by mouth 4 (four) times daily. Phineas Semen, MD Taking Active             Home Care and Equipment/Supplies: Were Home Health Services Ordered?: NA Any new equipment or medical supplies ordered?: NA  Functional Questionnaire: Do you need assistance with bathing/showering or dressing?: No Do you need assistance with meal preparation?: No Do you need assistance with eating?: No Do you have difficulty maintaining continence: No Do you need assistance with getting out of bed/getting out of a chair/moving?: No Do you have difficulty managing or taking your medications?: No  Follow up appointments reviewed: PCP Follow-up appointment confirmed?: Yes Date of PCP follow-up appointment?: 04/29/23 Follow-up Provider: PCP/Dr. Ashley Royalty Specialist [facility] Follow-up appointment confirmed?: NA Do you need transportation to your follow-up appointment?: No Do you understand care options if your condition(s) worsen?: Yes-patient verbalized understanding  SDOH Interventions Today    Flowsheet Row Most Recent Value  SDOH Interventions   Transportation Interventions Intervention Not Indicated       Estanislado Emms RN, BSN [place]  Managed [facility] RN Care Coordinator [phone]

## 2023-04-29 ENCOUNTER — Encounter: Payer: Self-pay | Admitting: Family Medicine

## 2023-04-29 ENCOUNTER — Ambulatory Visit (INDEPENDENT_AMBULATORY_CARE_PROVIDER_SITE_OTHER): Payer: BLUE CROSS/BLUE SHIELD | Admitting: Family Medicine

## 2023-04-29 VITALS — BP 118/78 | HR 98 | Ht 63.0 in | Wt 238.0 lb

## 2023-04-29 DIAGNOSIS — L732 Hidradenitis suppurativa: Secondary | ICD-10-CM

## 2023-04-29 DIAGNOSIS — D500 Iron deficiency anemia secondary to blood loss (chronic): Secondary | ICD-10-CM | POA: Insufficient documentation

## 2023-04-29 DIAGNOSIS — K2100 Gastro-esophageal reflux disease with esophagitis, without bleeding: Secondary | ICD-10-CM | POA: Diagnosis not present

## 2023-04-29 DIAGNOSIS — N920 Excessive and frequent menstruation with regular cycle: Secondary | ICD-10-CM | POA: Insufficient documentation

## 2023-04-29 NOTE — Progress Notes (Signed)
     Primary Care / Sports Medicine Office Visit  Patient Information:  Patient ID: Kara House, female DOB: 2001-03-14 Age: 22 y.o. MRN: [id number]   Kara House is a pleasant 22 y.o. female presenting with the following:  Chief Complaint  Patient presents with   Follow-up    Derm went well and was given the mediation that she wants, GI stated that she wasn't a candidate for any Epi or GI procedure.     Vitals:   04/29/23 0805  BP: 118/78  Pulse: 98  SpO2: 99%   Vitals:   04/29/23 0805  Weight: 238 lb (108 kg)  Height: 5\' 3"  (1.6 m)   Body mass index is 42.16 kg/m.     Independent interpretation of notes and tests performed by another provider:   None  Procedures performed:   None  Pertinent History, Exam, Impression, and Recommendations:   Kara House was seen today for follow-up.  Gastroesophageal reflux disease with esophagitis without hemorrhage Assessment & Plan: Chronic, without adequate control necessitating ER visit on 04/24/2023.  States that she has been doing relatively well, when this was quantified however, she brings up bouts of symptomatology at least twice a week not responding to Tums.  At ER visit, negative sonographic findings, concern for gastritis, prescribed Carafate which she did not have a chance to start being uncertain of what the medication was for, and continues to be symptomatic.  This is in the setting of iron deficiency anemia.  We reviewed her current symptoms and lack of adequate symptom control, plan as follows:  -Revisit GI, patient encouraged to speak freely about her current level of weekly symptoms - Dose Carafate as prescribed by ER - Dose pantoprazole daily x 2 weeks then daily as needed - Continue lifestyle changes - We will follow peripherally   Hidradenitis suppurativa Assessment & Plan: Established with dermatology, medications updated, doing very well.  We will follow peripherally on this issue.   Menorrhagia  with regular cycle Assessment & Plan: Chronic, has IUD.  Given her iron deficiency anemia I have advised patient to touch base with her GYN at [facility] for further evaluation and management.   Iron deficiency anemia due to chronic blood loss Assessment & Plan: In the setting of comorbid GERD and menorrhagia. See additional assessment(s) for plan details.      Orders & Medications No orders of the defined types were placed in this encounter.  No orders of the defined types were placed in this encounter.    No follow-ups on file.     Jerrol Banana, MD, [facility]   Primary Care Sports Medicine Primary Care and Sports Medicine at [facility]

## 2023-04-29 NOTE — Assessment & Plan Note (Signed)
Chronic, without adequate control necessitating ER visit on 04/24/2023.  States that she has been doing relatively well, when this was quantified however, she brings up bouts of symptomatology at least twice a week not responding to Tums.  At ER visit, negative sonographic findings, concern for gastritis, prescribed Carafate which she did not have a chance to start being uncertain of what the medication was for, and continues to be symptomatic.  This is in the setting of iron deficiency anemia.  We reviewed her current symptoms and lack of adequate symptom control, plan as follows:  -Revisit GI, patient encouraged to speak freely about her current level of weekly symptoms - Dose Carafate as prescribed by ER - Dose pantoprazole daily x 2 weeks then daily as needed - Continue lifestyle changes - We will follow peripherally

## 2023-04-29 NOTE — Assessment & Plan Note (Signed)
Chronic, has IUD.  Given her iron deficiency anemia I have advised patient to touch base with her GYN at [facility] for further evaluation and management.

## 2023-04-29 NOTE — Assessment & Plan Note (Signed)
Established with dermatology, medications updated, doing very well.  We will follow peripherally on this issue.

## 2023-04-29 NOTE — Patient Instructions (Addendum)
-   Dose the sucralfate (Carafate) from the ER, 4 times a day until prescription has completed - Restart pantoprazole daily on empty stomach x 2 weeks then daily as needed for any stomach/reflux symptoms - Continue to make lifestyle changes were able to do so - Contact GI to schedule a follow-up given recent symptoms and ER visit - Contact gynecology to schedule a visit regarding heavy periods and low blood count - Return for annual physical January 2025 - Contact us for any question/concerns between now and then

## 2023-04-29 NOTE — Assessment & Plan Note (Signed)
In the setting of comorbid GERD and menorrhagia. See additional assessment(s) for plan details.

## 2023-05-03 ENCOUNTER — Other Ambulatory Visit: Payer: Self-pay

## 2023-05-03 ENCOUNTER — Encounter: Payer: Self-pay | Admitting: Dermatology

## 2023-05-03 MED ORDER — TRETINOIN 0.05 % EX CREA: TOPICAL_CREAM | Freq: Every day | CUTANEOUS | 2 refills | Status: AC

## 2023-05-03 NOTE — Progress Notes (Signed)
Tretinoin 0.05% cr qhs to face

## 2023-05-09 ENCOUNTER — Encounter: Payer: Self-pay | Admitting: Dermatology

## 2023-05-11 ENCOUNTER — Other Ambulatory Visit: Payer: Self-pay

## 2023-05-11 ENCOUNTER — Encounter: Payer: Self-pay | Admitting: Family Medicine

## 2023-05-11 DIAGNOSIS — N946 Dysmenorrhea, unspecified: Secondary | ICD-10-CM

## 2023-05-23 ENCOUNTER — Encounter: Payer: Self-pay | Admitting: Family Medicine

## 2023-05-25 ENCOUNTER — Encounter: Payer: Self-pay | Admitting: Family Medicine

## 2023-05-25 ENCOUNTER — Ambulatory Visit (INDEPENDENT_AMBULATORY_CARE_PROVIDER_SITE_OTHER): Payer: BLUE CROSS/BLUE SHIELD | Admitting: Family Medicine

## 2023-05-25 VITALS — BP 100/72 | HR 96 | Ht 63.0 in | Wt 240.0 lb

## 2023-05-25 DIAGNOSIS — K2100 Gastro-esophageal reflux disease with esophagitis, without bleeding: Secondary | ICD-10-CM | POA: Diagnosis not present

## 2023-05-25 DIAGNOSIS — Z7689 Persons encountering health services in other specified circumstances: Secondary | ICD-10-CM

## 2023-05-25 MED ORDER — WEGOVY 0.25 MG/0.5ML ~~LOC~~ SOAJ: 0.2500 mg | SUBCUTANEOUS | 2 refills | Status: DC

## 2023-05-25 NOTE — Assessment & Plan Note (Addendum)
Has been controlled with consistent pantoprazole and dietary changes. She did not notice worsening on a recent dinner out where she had spicy foods - which is a typical trigger for her. Overall doing well. Had some throat pain intolerance with the Carafate and self-discontinued that medication.  Plan: - Transition to as-needed dosing of Protonix (pantoprazole) - Continue diet and lifestyle changes to reduce reflux (GERD) symptoms

## 2023-05-25 NOTE — Progress Notes (Signed)
     Primary Care / Sports Medicine Office Visit  Patient Information:  Patient ID: Kara House, female DOB: [birth year] Age: 22 y.o. MRN: [id number]   Kara House is a pleasant 22 y.o. female presenting with the following:  Chief Complaint  Patient presents with   Weight Check    Would like to talk about medications,    Vitals:   05/25/23 1359  BP: 100/72  Pulse: 96  SpO2: 97%   Vitals:   05/25/23 1359  Weight: 240 lb (108.9 kg)  Height: 5\' 3"  (1.6 m)   Body mass index is 42.51 kg/m.  No results found.   Independent interpretation of notes and tests performed by another provider:   None  Procedures performed:   None  Pertinent History, Exam, Impression, and Recommendations:   Kara House was seen today for weight check.  Encounter for weight management Assessment & Plan: Chronic, has tried diet and exercise long-term with minimal response. We reviewed pharmacologic and non-pharmacologic strategies. The patient is asked to make an attempt to improve diet and exercise patterns to aid in medical management of this problem.  Plan: - Start Wegovy weekly - If noting stomach / GI side effects, modify diet (try to eat healthy) - Increase physical activity - Return in 1 month - If tolerating well, plan to titrate to [facility] 0.5  Orders: -     [id number]; Inject 0.25 mg into the skin once a week.  Dispense: 2 mL; Refill: 2  Gastroesophageal reflux disease with esophagitis without hemorrhage Assessment & Plan: Has been controlled with consistent pantoprazole and dietary changes. She did not notice worsening on a recent dinner out where she had spicy foods - which is a typical trigger for her. Overall doing well. Had some throat pain intolerance with the Carafate and self-discontinued that medication.  Plan: - Transition to as-needed dosing of Protonix (pantoprazole) - Continue diet and lifestyle changes to reduce reflux (GERD) symptoms      Orders &  Medications Meds ordered this encounter  Medications   WEGOVY 0.25 MG/0.5ML SOAJ    Sig: Inject 0.25 mg into the skin once a week.    Dispense:  2 mL    Refill:  2   No orders of the defined types were placed in this encounter.    Return in about 4 weeks (around 06/22/2023).     Jerrol Banana, MD, [facility]   Primary Care Sports Medicine Primary Care and Sports Medicine at [facility]

## 2023-05-25 NOTE — Patient Instructions (Addendum)
-   Transition to as-needed dosing of Protonix (pantoprazole) - Continue diet and lifestyle changes to reduce reflux (GERD) symptoms - Start [employer] weekly - If noting stomach / GI side effects, modify diet (try to eat healthy) - Increase physical activity - Return in 1 month - Contact for any questions / concerns between now and then

## 2023-05-25 NOTE — Assessment & Plan Note (Addendum)
Chronic, has tried diet and exercise long-term with minimal response. We reviewed pharmacologic and non-pharmacologic strategies. The patient is asked to make an attempt to improve diet and exercise patterns to aid in medical management of this problem.  Plan: - Start Wegovy weekly - If noting stomach / GI side effects, modify diet (try to eat healthy) - Increase physical activity - Return in 1 month - If tolerating well, plan to titrate to [facility] 0.5

## 2023-06-24 ENCOUNTER — Ambulatory Visit: Payer: BLUE CROSS/BLUE SHIELD | Admitting: Family Medicine

## 2023-06-25 ENCOUNTER — Ambulatory Visit: Payer: BLUE CROSS/BLUE SHIELD | Admitting: Family Medicine

## 2023-06-28 ENCOUNTER — Other Ambulatory Visit: Payer: Self-pay

## 2023-06-28 MED ORDER — SEMAGLUTIDE(0.25 OR 0.5MG/DOS) 2 MG/1.5ML ~~LOC~~ SOPN: 0.2500 mg | PEN_INJECTOR | SUBCUTANEOUS | 1 refills | Status: DC

## 2023-07-12 ENCOUNTER — Other Ambulatory Visit: Payer: Self-pay | Admitting: Dermatology

## 2023-08-27 ENCOUNTER — Ambulatory Visit (INDEPENDENT_AMBULATORY_CARE_PROVIDER_SITE_OTHER): Payer: BLUE CROSS/BLUE SHIELD

## 2023-08-27 DIAGNOSIS — Z23 Encounter for immunization: Secondary | ICD-10-CM | POA: Diagnosis not present

## 2023-11-02 ENCOUNTER — Encounter: Payer: Self-pay | Admitting: Family Medicine

## 2023-11-02 ENCOUNTER — Ambulatory Visit (INDEPENDENT_AMBULATORY_CARE_PROVIDER_SITE_OTHER): Payer: Medicaid Other | Admitting: Family Medicine

## 2023-11-02 VITALS — BP 100/60 | HR 93 | Ht 63.0 in | Wt 239.6 lb

## 2023-11-02 DIAGNOSIS — Z124 Encounter for screening for malignant neoplasm of cervix: Secondary | ICD-10-CM | POA: Diagnosis not present

## 2023-11-02 DIAGNOSIS — L732 Hidradenitis suppurativa: Secondary | ICD-10-CM

## 2023-11-02 DIAGNOSIS — R7309 Other abnormal glucose: Secondary | ICD-10-CM

## 2023-11-02 DIAGNOSIS — K2100 Gastro-esophageal reflux disease with esophagitis, without bleeding: Secondary | ICD-10-CM | POA: Diagnosis not present

## 2023-11-02 DIAGNOSIS — R5382 Chronic fatigue, unspecified: Secondary | ICD-10-CM | POA: Diagnosis not present

## 2023-11-02 DIAGNOSIS — Z6841 Body Mass Index (BMI) 40.0 and over, adult: Secondary | ICD-10-CM

## 2023-11-02 DIAGNOSIS — Z118 Encounter for screening for other infectious and parasitic diseases: Secondary | ICD-10-CM | POA: Diagnosis not present

## 2023-11-02 DIAGNOSIS — E210 Primary hyperparathyroidism: Secondary | ICD-10-CM | POA: Diagnosis not present

## 2023-11-02 DIAGNOSIS — Z7689 Persons encountering health services in other specified circumstances: Secondary | ICD-10-CM | POA: Diagnosis not present

## 2023-11-02 DIAGNOSIS — Z0000 Encounter for general adult medical examination without abnormal findings: Secondary | ICD-10-CM

## 2023-11-02 DIAGNOSIS — D500 Iron deficiency anemia secondary to blood loss (chronic): Secondary | ICD-10-CM

## 2023-11-02 DIAGNOSIS — Z136 Encounter for screening for cardiovascular disorders: Secondary | ICD-10-CM

## 2023-11-02 MED ORDER — PANTOPRAZOLE SODIUM 40 MG PO TBEC
40.0000 mg | DELAYED_RELEASE_TABLET | Freq: Every day | ORAL | 3 refills | Status: DC | PRN
Start: 2023-11-02 — End: 2024-05-19

## 2023-11-02 MED ORDER — SEMAGLUTIDE-WEIGHT MANAGEMENT 0.5 MG/0.5ML ~~LOC~~ SOAJ: 0.5000 mg | SUBCUTANEOUS | 0 refills | Status: AC

## 2023-11-02 MED ORDER — SEMAGLUTIDE-WEIGHT MANAGEMENT 0.25 MG/0.5ML ~~LOC~~ SOAJ: 0.2500 mg | SUBCUTANEOUS | 0 refills | Status: DC

## 2023-11-02 NOTE — Progress Notes (Signed)
Annual Physical Exam Visit  Patient Information:  Patient ID: Kara House, female DOB: Feb 13, 2001 Age: 23 y.o. MRN: [id number]   Subjective:   CC: Annual Physical Exam  HPI:  Kara House is here for their annual physical.  I reviewed the past medical history, family history, social history, surgical history, and allergies today and changes were made as necessary.  Please see the problem list section below for additional details.  Past Medical History: History reviewed. No pertinent past medical history. Past Surgical History: Past Surgical History:  Procedure Laterality Date   PARATHYROIDECTOMY  11/2021   TONSILLECTOMY     Family History: History reviewed. No pertinent family history. Allergies: No Known Allergies Health Maintenance: Health Maintenance  Topic Date Due   Cervical Cancer Screening (Pap smear)  Never done   COVID-19 Vaccine (1 - 2024-25 season) 11/18/2023 (Originally 06/13/2023)   HPV VACCINES (1 - 3-dose series) 11/01/2024 (Originally 03/20/2015)   DTaP/Tdap/Td (3 - Td or Tdap) 09/11/2031   INFLUENZA VACCINE  Completed   Hepatitis C Screening  Completed   HIV Screening  Completed    HM Colonoscopy   This patient has no relevant Health Maintenance data.    Medications: Current Outpatient Medications on File Prior to Visit  Medication Sig Dispense Refill   ferrous sulfate 325 (65 FE) MG EC tablet Take 325 mg by mouth 3 (three) times daily with meals.     clindamycin (CLEOCIN-T) 1 % lotion Apply topically daily. 60 mL 5   fluocinonide (LIDEX) 0.05 % external solution APPLY 1 TO 2 DROPS TO AFFECTED AREAS ON SCALP ONCE TO TWICE DAILY FOR ITCHING 60 mL 0   Semaglutide,0.25 or 0.5MG /DOS, 2 MG/1.5ML SOPN Inject 0.25 mg into the skin once a week. (Patient not taking: Reported on 11/02/2023) 1.5 mL 1   tretinoin (RETIN-A) 0.05 % cream Apply topically at bedtime. qhs to face as tolerated for acne (Patient not taking: Reported on 11/02/2023) 45 g 2   WEGOVY  0.25 MG/0.5ML SOAJ Inject 0.25 mg into the skin once a week. (Patient not taking: Reported on 11/02/2023) 2 mL 2   No current facility-administered medications on file prior to visit.    Objective:   Vitals:   11/02/23 0757  BP: 100/60  Pulse: 93  SpO2: 97%   Vitals:   11/02/23 0757  Weight: 239 lb 9.6 oz (108.7 kg)  Height: 5\' 3"  (1.6 m)   Body mass index is 42.44 kg/m.  General: Well Developed, well nourished, and in no acute distress.  Neuro: Alert and oriented x3, extra-ocular muscles intact, sensation grossly intact. Cranial nerves II through XII are grossly intact, motor, sensory, and coordinative functions are intact. HEENT: Normocephalic, atraumatic, neck supple, no masses, no lymphadenopathy, thyroid nonenlarged. Oropharynx, nasopharynx, external ear canals are unremarkable. Skin: Warm and dry, no rashes noted.  Cardiac: Regular rate and rhythm, no murmurs rubs or gallops. No peripheral edema. Pulses symmetric. Respiratory: Clear to auscultation bilaterally. Speaking in full sentences.  Abdominal: Soft, nontender, nondistended, positive bowel sounds, no masses, no organomegaly. Musculoskeletal: Stable, and with full range of motion.   Impression and Recommendations:   The patient was counselled, risk factors were discussed, and anticipatory guidance given.  Problem List Items Addressed This Visit     BMI 39.0-39.9,adult   Relevant Medications   Semaglutide-Weight Management 0.25 MG/0.5ML SOAJ   Semaglutide-Weight Management 0.5 MG/0.5ML SOAJ (Start on 12/01/2023)   Encounter for weight management   Weight Management Discussed starting [id number] for weight loss, potential  side effects including nausea and bowel changes, and the need for regular follow-ups. [facility], with dose escalation as tolerated. -Check weight in 2 months. -Encouraged to monitor diet and bowel movements.      Relevant Medications   Semaglutide-Weight Management 0.25 MG/0.5ML SOAJ    Semaglutide-Weight Management 0.5 MG/0.5ML SOAJ (Start on 12/01/2023)   Gastroesophageal reflux disease with esophagitis without hemorrhage   Gastroesophageal Reflux Disease (GERD) Reports relief with Pantoprazole, primarily needed with consumption of spicy foods. -Continue Pantoprazole as needed. -Encouraged lifestyle modifications to reduce need for medication.      Relevant Medications   pantoprazole (PROTONIX) 40 MG tablet   Healthcare maintenance - Primary   Annual examination completed, risk stratification labs ordered, anticipatory guidance provided.  We will follow labs once resulted.  Gynecological Health Discussed need for up-to-date screening and referral to gynecology. -Referral to [facility] for gynecological screening.      Hidradenitis suppurativa   Hidradenitis Suppurativa Reports widespread involvement, currently under the care of dermatology with ongoing treatment including topical Clindamycin and Doxycycline. -Continue current treatment plan under dermatology.      Hyperparathyroidism, primary (HCC)   Relevant Orders   Parathyroid hormone, intact (no Ca)   Iron deficiency anemia due to chronic blood loss   Iron Deficiency Reports low energy, known iron deficiency. -Order labs to check iron and thyroid levels.      Relevant Orders   CBC   Iron, TIBC and Ferritin Panel   Other Visit Diagnoses       Cervical cancer screening         Screening for chlamydial disease         Screening for cardiovascular condition       Relevant Orders   Comprehensive metabolic panel   Lipid panel     Abnormal glucose       Relevant Orders   Hemoglobin A1c     Chronic fatigue       Relevant Orders   VITAMIN D 25 Hydroxy (Vit-D Deficiency, Fractures)   TSH        Orders & Medications Medications:  Meds ordered this encounter  Medications   pantoprazole (PROTONIX) 40 MG tablet    Sig: Take 1 tablet (40 mg total) by mouth daily as needed. 30+ minutes prior to  dinner on empty stomach.    Dispense:  90 tablet    Refill:  3   Semaglutide-Weight Management 0.25 MG/0.5ML SOAJ    Sig: Inject 0.25 mg into the skin once a week for 28 days.    Dispense:  2 mL    Refill:  0   Semaglutide-Weight Management 0.5 MG/0.5ML SOAJ    Sig: Inject 0.5 mg into the skin once a week for 28 days.    Dispense:  2 mL    Refill:  0   Orders Placed This Encounter  Procedures   CBC   Comprehensive metabolic panel   Hemoglobin A1c   Lipid panel   VITAMIN D 25 Hydroxy (Vit-D Deficiency, Fractures)   TSH   Iron, TIBC and Ferritin Panel   Parathyroid hormone, intact (no Ca)     Return in about 2 months (around 12/31/2023) for Weight MGMT f/u.    Jerrol Banana, MD, [facility]   Primary Care Sports Medicine Primary Care and Sports Medicine at [facility]

## 2023-11-02 NOTE — Assessment & Plan Note (Signed)
Iron Deficiency Reports low energy, known iron deficiency. -Order labs to check iron and thyroid levels.

## 2023-11-02 NOTE — Assessment & Plan Note (Signed)
Annual examination completed, risk stratification labs ordered, anticipatory guidance provided.  We will follow labs once resulted.  Gynecological Health Discussed need for up-to-date screening and referral to gynecology. -Referral to [facility] for gynecological screening.

## 2023-11-02 NOTE — Assessment & Plan Note (Signed)
Weight Management Discussed starting ZOXWRU for weight loss, potential side effects including nausea and bowel changes, and the need for regular follow-ups. [facility], with dose escalation as tolerated. -Check weight in 2 months. -Encouraged to monitor diet and bowel movements.

## 2023-11-02 NOTE — Assessment & Plan Note (Signed)
Hidradenitis Suppurativa Reports widespread involvement, currently under the care of dermatology with ongoing treatment including topical Clindamycin and Doxycycline. -Continue current treatment plan under dermatology.

## 2023-11-02 NOTE — Patient Instructions (Addendum)
-   Obtain fasting labs with orders provided (can have water or black coffee but otherwise no food or drink x 8 hours before labs) - Review information provided - Attend eye doctor annually, dentist every 6 months, work towards or maintain 30 minutes of moderate intensity physical activity at least 5 days per week, and consume a balanced diet - Contact us for any questions between now and then   Your Plan:  - Gynecological Health: Referral to [facility] for screening. - GERD: Continue pantoprazole as needed; make lifestyle changes. - Hidradenitis Suppurativa: Continue clindamycin and doxycycline. - Weight Management: Start ONGEXB; monitor diet and weight in 2 months. Follow-up in 2 months. - Iron Deficiency: Order labs; adjust treatment as needed.

## 2023-11-02 NOTE — Assessment & Plan Note (Signed)
Gastroesophageal Reflux Disease (GERD) Reports relief with Pantoprazole, primarily needed with consumption of spicy foods. -Continue Pantoprazole as needed. -Encouraged lifestyle modifications to reduce need for medication.

## 2023-11-03 LAB — HEMOGLOBIN A1C
Est. average glucose Bld gHb Est-mCnc: 120 mg/dL
Hgb A1c MFr Bld: 5.8 % — ABNORMAL HIGH (ref 4.8–5.6)

## 2023-11-03 LAB — COMPREHENSIVE METABOLIC PANEL
ALT: 18 [IU]/L (ref 0–32)
AST: 17 [IU]/L (ref 0–40)
Albumin: 4 g/dL (ref 4.0–5.0)
Alkaline Phosphatase: 126 [IU]/L — ABNORMAL HIGH (ref 44–121)
BUN/Creatinine Ratio: 18 (ref 9–23)
BUN: 9 mg/dL (ref 6–20)
Bilirubin Total: <0.2 mg/dL (ref 0.0–1.2)
CO2: 21 mmol/L (ref 20–29)
Calcium: 9 mg/dL (ref 8.7–10.2)
Chloride: 102 mmol/L (ref 96–106)
Creatinine, Ser: 0.5 mg/dL — ABNORMAL LOW (ref 0.57–1.00)
Globulin, Total: 3 g/dL (ref 1.5–4.5)
Glucose: 80 mg/dL (ref 70–99)
Potassium: 4.7 mmol/L (ref 3.5–5.2)
Sodium: 140 mmol/L (ref 134–144)
Total Protein: 7 g/dL (ref 6.0–8.5)
eGFR: 135 mL/min/{1.73_m2} (ref >59)

## 2023-11-03 LAB — IRON,TIBC AND FERRITIN PANEL
Ferritin: 4 ng/mL — ABNORMAL LOW (ref 15–150)
Iron Saturation: 4 % — CL (ref 15–55)
Iron: 17 ug/dL — ABNORMAL LOW (ref 27–159)
Total Iron Binding Capacity: 447 ug/dL (ref 250–450)
UIBC: 430 ug/dL — ABNORMAL HIGH (ref 131–425)

## 2023-11-03 LAB — CBC
Hematocrit: 35.4 % (ref 34.0–46.6)
Hemoglobin: 10.3 g/dL — ABNORMAL LOW (ref 11.1–15.9)
MCH: 21.5 pg — ABNORMAL LOW (ref 26.6–33.0)
MCHC: 29.1 g/dL — ABNORMAL LOW (ref 31.5–35.7)
MCV: 74 fL — ABNORMAL LOW (ref 79–97)
Platelets: 439 10*3/uL (ref 150–450)
RBC: 4.8 x10E6/uL (ref 3.77–5.28)
RDW: 14.6 % (ref 11.7–15.4)
WBC: 5.9 10*3/uL (ref 3.4–10.8)

## 2023-11-03 LAB — LIPID PANEL
Chol/HDL Ratio: 3.6 {ratio} (ref 0.0–4.4)
Cholesterol, Total: 127 mg/dL (ref 100–199)
HDL: 35 mg/dL — ABNORMAL LOW (ref >39)
LDL Chol Calc (NIH): 69 mg/dL (ref 0–99)
Triglycerides: 126 mg/dL (ref 0–149)
VLDL Cholesterol Cal: 23 mg/dL (ref 5–40)

## 2023-11-03 LAB — PARATHYROID HORMONE, INTACT (NO CA): PTH: 47 pg/mL (ref 15–65)

## 2023-11-03 LAB — VITAMIN D 25 HYDROXY (VIT D DEFICIENCY, FRACTURES): Vit D, 25-Hydroxy: 16.5 ng/mL — ABNORMAL LOW (ref 30.0–100.0)

## 2023-11-03 LAB — TSH: TSH: 1.71 u[IU]/mL (ref 0.450–4.500)

## 2023-11-04 ENCOUNTER — Encounter: Payer: Self-pay | Admitting: Family Medicine

## 2023-11-04 ENCOUNTER — Other Ambulatory Visit: Payer: Self-pay | Admitting: Family Medicine

## 2023-11-04 DIAGNOSIS — D500 Iron deficiency anemia secondary to blood loss (chronic): Secondary | ICD-10-CM

## 2023-11-04 DIAGNOSIS — E559 Vitamin D deficiency, unspecified: Secondary | ICD-10-CM

## 2023-11-04 MED ORDER — VITAMIN D (ERGOCALCIFEROL) 1.25 MG (50000 UNIT) PO CAPS: 50000.0000 [IU] | ORAL_CAPSULE | ORAL | 0 refills | Status: DC

## 2023-11-04 NOTE — Telephone Encounter (Signed)
Please review and advise. Thanks   JM

## 2023-11-05 ENCOUNTER — Telehealth: Payer: Self-pay

## 2023-11-05 ENCOUNTER — Encounter: Payer: Medicaid Other | Admitting: Oncology

## 2023-11-05 ENCOUNTER — Other Ambulatory Visit: Payer: Medicaid Other

## 2023-11-05 NOTE — Telephone Encounter (Signed)
Please review.  KP

## 2023-11-08 ENCOUNTER — Ambulatory Visit (INDEPENDENT_AMBULATORY_CARE_PROVIDER_SITE_OTHER): Payer: Medicaid Other | Admitting: Dermatology

## 2023-11-08 DIAGNOSIS — L719 Rosacea, unspecified: Secondary | ICD-10-CM

## 2023-11-08 DIAGNOSIS — L732 Hidradenitis suppurativa: Secondary | ICD-10-CM

## 2023-11-08 DIAGNOSIS — L219 Seborrheic dermatitis, unspecified: Secondary | ICD-10-CM | POA: Diagnosis not present

## 2023-11-08 DIAGNOSIS — L700 Acne vulgaris: Secondary | ICD-10-CM | POA: Diagnosis not present

## 2023-11-08 DIAGNOSIS — L249 Irritant contact dermatitis, unspecified cause: Secondary | ICD-10-CM

## 2023-11-08 MED ORDER — KETOCONAZOLE 2 % EX SHAM: MEDICATED_SHAMPOO | CUTANEOUS | 5 refills | Status: DC

## 2023-11-08 MED ORDER — MOMETASONE FUROATE 0.1 % EX CREA: TOPICAL_CREAM | CUTANEOUS | 1 refills | Status: DC

## 2023-11-08 MED ORDER — CLINDAMYCIN PHOSPHATE 1 % EX LOTN: TOPICAL_LOTION | CUTANEOUS | 5 refills | Status: DC

## 2023-11-08 MED ORDER — METRONIDAZOLE 0.75 % EX CREA: TOPICAL_CREAM | CUTANEOUS | 5 refills | Status: DC

## 2023-11-08 MED ORDER — DOXYCYCLINE MONOHYDRATE 100 MG PO CAPS: ORAL_CAPSULE | ORAL | 1 refills | Status: DC

## 2023-11-08 NOTE — Patient Instructions (Addendum)
Gentle Skin Care Guide  1. Bathe no more than once a day.  2. Avoid bathing in hot water  3. Use a mild soap like Dove, Vanicream, Cetaphil, CeraVe. Can use Lever 2000 or Cetaphil antibacterial soap  4. Use soap only where you need it. On most days, use it under your arms, between your legs, and on your feet. Let the water rinse other areas unless visibly dirty.  5. When you get out of the bath/shower, use a towel to gently blot your skin dry, don't rub it.  6. While your skin is still a little damp, apply a moisturizing cream such as Vanicream, CeraVe, Cetaphil, Eucerin, Sarna lotion or plain Vaseline Jelly. For hands apply Neutrogena [place] Hand Cream or Excipial Hand Cream.  7. Reapply moisturizer any time you start to itch or feel dry.  8. Sometimes using free and clear laundry detergents can be helpful. Fabric softener sheets should be avoided. Downy Free & Gentle liquid, or any liquid fabric softener that is free of dyes and perfumes, it acceptable to use  9. If your doctor has given you prescription creams you may apply moisturizers over them    Due to recent changes in healthcare laws, you may see results of your pathology and/or laboratory studies on MyChart before the doctors have had a chance to review them. We understand that in some cases there may be results that are confusing or concerning to you. Please understand that not all results are received at the same time and often the doctors may need to interpret multiple results in order to provide you with the best plan of care or course of treatment. Therefore, we ask that you please give [place] 2 business days to thoroughly review all your results before contacting the office for clarification. Should we see a critical lab result, you will be contacted sooner.   If You Need Anything After Your Visit  If you have any questions or concerns for your doctor, please call our main line at [phone] and press option 4 to reach your  doctor's medical assistant. If no one answers, please leave a voicemail as directed and we will return your call as soon as possible. Messages left after 4 pm will be answered the following business day.   You may also send [place] a message via MyChart. We typically respond to MyChart messages within 1-2 business days.  For prescription refills, please ask your pharmacy to contact our office. Our fax number is [phone].  If you have an urgent issue when the clinic is closed that cannot wait until the next business day, you can page your doctor at the number below.    Please note that while we do our best to be available for urgent issues outside of office hours, we are not available 24/7.   If you have an urgent issue and are unable to reach [place], you may choose to seek medical care at your doctor's office, retail clinic, urgent care center, or emergency room.  If you have a medical emergency, please immediately call 911 or go to the emergency department.  Pager Numbers  - Dr. Gwen Pounds: [phone]  - Dr. Roseanne Reno: [phone]  - Dr. Katrinka Blazing: [phone]   In the event of inclement weather, please call our main line at [phone] for an update on the status of any delays or closures.  Dermatology Medication Tips: Please keep the boxes that topical medications come in in order to help keep track of the instructions about where  and how to use these. Pharmacies typically print the medication instructions only on the boxes and not directly on the medication tubes.   If your medication is too expensive, please contact our office at [phone] option 4 or send [place] a message through MyChart.   We are unable to tell what your co-pay for medications will be in advance as this is different depending on your insurance coverage. However, we may be able to find a substitute medication at lower cost or fill out paperwork to get insurance to cover a needed medication.   If a prior authorization is  required to get your medication covered by your insurance company, please allow [place] 1-2 business days to complete this process.  Drug prices often vary depending on where the prescription is filled and some pharmacies may offer cheaper prices.  The website www.goodrx.com contains coupons for medications through different pharmacies. The prices here do not account for what the cost may be with help from insurance (it may be cheaper with your insurance), but the website can give you the price if you did not use any insurance.  - You can print the associated coupon and take it with your prescription to the pharmacy.  - You may also stop by our office during regular business hours and pick up a GoodRx coupon card.  - If you need your prescription sent electronically to a different pharmacy, notify our office through [facility] or by phone at [phone] option 4.     Si Usted Necesita Algo Despus de Su Visita  Tambin puede enviarnos un mensaje a travs de Clinical cytogeneticist. Por lo general respondemos a los mensajes de MyChart en el transcurso de 1 a 2 das hbiles.  Para renovar recetas, por favor pida a su farmacia que se ponga en contacto con nuestra oficina. Annie Sable de fax es [place] [phone].  Si tiene un asunto urgente cuando la clnica est cerrada y que no puede esperar hasta el siguiente da hbil, puede llamar/localizar a su doctor(a) al nmero que aparece a continuacin.   Por favor, tenga en cuenta que aunque hacemos todo lo posible para estar disponibles para asuntos urgentes fuera del horario de [place], no estamos disponibles las 24 horas del da, los 7 [address]  [address] de la [place].   Si tiene un problema urgente y no puede comunicarse con nosotros, puede optar por buscar atencin mdica  en el consultorio de su doctor(a), en una clnica privada, en un centro de atencin urgente o en una sala de emergencias.  Si tiene Engineer, drilling, por favor llame inmediatamente al 911 o vaya a  la sala de emergencias.  Nmeros de bper  - Dr. Gwen Pounds: [phone]  - Dra. Roseanne Reno: [phone]  - Dr. Katrinka Blazing: [phone]   En caso de inclemencias del tiempo, por favor llame a Lacy Duverney principal al [phone] para una actualizacin sobre el [place] de cualquier retraso o cierre.  Consejos para la medicacin en dermatologa: Por favor, guarde las cajas en las que vienen los medicamentos de uso tpico para ayudarle a seguir las instrucciones sobre dnde y cmo usarlos. Las farmacias generalmente imprimen las instrucciones del medicamento slo en las cajas y no directamente en los tubos del [place].   Si su medicamento es muy caro, por favor, pngase en contacto con Rolm Gala llamando al [phone] y presione la opcin 4 o envenos un mensaje a travs de Clinical cytogeneticist.   No podemos decirle cul ser su copago por los medicamentos por adelantado ya que esto  es diferente dependiendo de la cobertura de su seguro. Sin embargo, es posible que podamos encontrar un medicamento sustituto a Audiological scientist un formulario para que el seguro cubra el medicamento que se considera necesario.   Si se requiere una autorizacin previa para que su compaa de seguros [place] su medicamento, por favor permtanos de 1 a 2 das hbiles para completar [address]  Los precios de los medicamentos varan con frecuencia dependiendo del Environmental consultant de dnde se surte la receta y alguna farmacias pueden ofrecer precios ms baratos.  El sitio web www.goodrx.com tiene cupones para medicamentos de Health and safety inspector. Los precios aqu no tienen en cuenta lo que podra costar con la ayuda del seguro (puede ser ms barato con su seguro), pero el sitio web puede darle el precio si no utiliz Tourist information centre manager.  - Puede imprimir el cupn correspondiente y llevarlo con su receta a la farmacia.  - Tambin puede pasar por nuestra oficina durante el horario de atencin regular y Education officer, museum una tarjeta de cupones de  GoodRx.  - Si necesita que su receta se enve electrnicamente a una farmacia diferente, informe a nuestra oficina a travs de MyChart de [place] o por telfono llamando al [phone] y presione la opcin 4.

## 2023-11-08 NOTE — Progress Notes (Signed)
Follow-Up Visit   Subjective  Kara House is a 23 y.o. female who presents for the following: Acne of the face, pt had a rash after using Tretinoin 0.05% cream after 5 days so she stopped using it. HS - well controlled with Clindamycin lotion and Doxycycline 100 mg po QD PRN flares. Scalp has improved, patient currently using Ketoconazole 2% shampoo 1-2 x per week, pt hasn't needed to use Lidex solution since well controlled with Ketoconazole 2% shampoo. Pt c/o rash on the hands that started 2-3 weeks ago, dry, cracking, burning.   The following portions of the chart were reviewed this encounter and updated as appropriate: medications, allergies, medical history  Review of Systems:  No other skin or systemic complaints except as noted in HPI or Assessment and Plan.  Objective  Well appearing patient in no apparent distress; mood and affect are within normal limits.   A focused examination was performed of the following areas: the face and hands   Relevant exam findings are noted in the Assessment and Plan.    Assessment & Plan   HIDRADENITIS SUPPURATIVA Exam: Pt states axilla clear today, flares in the summer.  Chronic condition with duration or expected duration over one year. Currently well-controlled.  Hidradenitis Suppurativa is a chronic; persistent; non-curable, but treatable condition due to abnormal inflamed sweat glands in the body folds (axilla, inframammary, groin, medial thighs), causing recurrent painful draining cysts and scarring. It can be associated with severe scarring acne and cysts; also abscesses and scarring of scalp. The goal is control and prevention of flares, as it is not curable. Scars are permanent and can be thickened. Treatment may include daily use of topical medication and oral antibiotics.  Oral isotretinoin may also be helpful.  For some cases, Humira or Cosentyx (biologic injections) may be prescribed to decrease the inflammatory process and  prevent flares.  When indicated, inflamed cysts may also be treated surgically.  Treatment Plan: Continue Clindamycin lotion QD, and Doxycycline 100 mg po QD PRN flares. Doxycycline should be taken with food to prevent nausea. Do not lay down for 30 minutes after taking. Be cautious with sun exposure and use good sun protection while on this medication. Pregnant women should not take this medication.   ACNE VULGARIS with rosacea Exam: Pink papules on the malar cheeks, inflamed comedones on the cheeks and chin.   Chronic and persistent condition with duration or expected duration over one year. Condition is symptomatic/ bothersome to patient. Not currently at goal.   Treatment Plan: Start Metronidazole 0.75% cream QD-BID.  Trade size given of Neutrogena Adapalene 0.1% gel apply a thin coat at bedtime as tolerated.   SEBORRHEIC DERMATITIS Exam: scalp clear today  Chronic condition with duration or expected duration over one year. Currently well-controlled.  Seborrheic Dermatitis is a chronic persistent rash characterized by pinkness and scaling most commonly of the mid face but also can occur on the scalp (dandruff), ears; mid chest, mid back and groin.  It tends to be exacerbated by stress and cooler weather.  People who have neurologic disease may experience new onset or exacerbation of existing seborrheic dermatitis.  The condition is not curable but treatable and can be controlled.  Treatment Plan: Continue Ketoconazole 2% shampoo 1-2 days per week. Continue Lidex solution PRN itch.   Irritant Hand Dermatitis  Exam: Scaly hyperpigmented patches with mild lichenification on the hands  Chronic and persistent condition with duration or expected duration over one year. Condition is bothersome/symptomatic for patient. Currently  flared.   Hand Dermatitis is a chronic type of eczema that can come and go on the hands and fingers.  While there is no cure, the rash and symptoms can be managed  with topical prescription medications, and for more severe cases, with systemic medications.  Recommend mild soap and routine use of moisturizing cream after handwashing.  Minimize soap/water exposure when possible.     Treatment Plan: Start Mometasone 0.1% cream QD-BID PRN. Topical steroids (such as triamcinolone, fluocinolone, fluocinonide, mometasone, clobetasol, halobetasol, betamethasone, hydrocortisone) can cause thinning and lightening of the skin if they are used for too long in the same area. Your physician has selected the right strength medicine for your problem and area affected on the body. Please use your medication only as directed by your physician to prevent side effects.   Recommend mild soap with hand washing. Recommend moisturizing throughout the day.   Return in about 6 months (around 05/07/2024) for HS, acne, seb derm, hand derm follow up.  Maylene Roes, CMA, am acting as scribe for Willeen Niece, MD .  Documentation: I have reviewed the above documentation for accuracy and completeness, and I agree with the above.  Willeen Niece, MD

## 2023-11-10 ENCOUNTER — Inpatient Hospital Stay: Payer: Medicaid Other | Attending: Oncology | Admitting: Oncology

## 2023-11-10 ENCOUNTER — Encounter: Payer: Self-pay | Admitting: Oncology

## 2023-11-10 ENCOUNTER — Inpatient Hospital Stay: Payer: Medicaid Other

## 2023-11-10 VITALS — BP 119/78 | HR 94 | Temp 98.2°F | Resp 18 | Wt 233.4 lb

## 2023-11-10 DIAGNOSIS — D500 Iron deficiency anemia secondary to blood loss (chronic): Secondary | ICD-10-CM

## 2023-11-10 DIAGNOSIS — D509 Iron deficiency anemia, unspecified: Secondary | ICD-10-CM | POA: Diagnosis not present

## 2023-11-10 NOTE — Assessment & Plan Note (Signed)
Labs are reviewed and discussed with patient. Lab Results  Component Value Date   HGB 10.3 (L) 11/02/2023   TIBC 447 11/02/2023   IRONPCTSAT 4 (LL) 11/02/2023   FERRITIN 4 (L) 11/02/2023    I discussed about option of proceed with IV Venofer treatments. I discussed about the potential risks including but not limited to allergic reactions/infusion reactions including anaphylactic reactions, phlebitis, high blood pressure, wheezing, SOB, skin rash, weight gain,dark urine, leg swelling, back pain, headache, nausea and fatigue, etc. Patient tolerates oral iron supplement poorly and desires to achieved higher level of iron faster for adequate hematopoesis. Plan IV venofer weekly x 4

## 2023-11-10 NOTE — Progress Notes (Signed)
Hematology/Oncology Consult note [phone] [phone]      Patient Care Team: Jerrol Banana, MD as PCP - General (Family Medicine)   REFERRING PROVIDER: Jerrol Banana, MD  CHIEF COMPLAINTS/REASON FOR VISIT:  Anemia  ASSESSMENT & PLAN:  Iron deficiency anemia due to chronic blood loss Labs are reviewed and discussed with patient. Lab Results  Component Value Date   HGB 10.3 (L) 11/02/2023   TIBC 447 11/02/2023   IRONPCTSAT 4 (LL) 11/02/2023   FERRITIN 4 (L) 11/02/2023    I discussed about option of proceed with IV Venofer treatments. I discussed about the potential risks including but not limited to allergic reactions/infusion reactions including anaphylactic reactions, phlebitis, high blood pressure, wheezing, SOB, skin rash, weight gain,dark urine, leg swelling, back pain, headache, nausea and fatigue, etc. Patient tolerates oral iron supplement poorly and desires to achieved higher level of iron faster for adequate hematopoesis. Plan IV venofer weekly x 4   Orders Placed This Encounter  Procedures   CBC with Differential (Cancer Center Only)    Standing Status:   Future    Expected Date:   02/08/2024    Expiration Date:   11/09/2024   Iron and TIBC    Standing Status:   Future    Expected Date:   02/08/2024    Expiration Date:   11/09/2024   Retic Panel    Standing Status:   Future    Expected Date:   02/08/2024    Expiration Date:   11/09/2024   Celiac panel 10    Standing Status:   Future    Expected Date:   02/08/2024    Expiration Date:   11/09/2024   Ferritin    Standing Status:   Future    Expected Date:   02/08/2024    Expiration Date:   11/09/2024   Follow-up in 3 months for evaluation of treatment response. All questions were answered. The patient knows to call the clinic with any problems, questions or concerns.  Rickard Patience, MD, PhD [facility] Hematology Oncology 11/10/2023     HISTORY OF PRESENTING ILLNESS:  Kara House is a  23 y.o.  female with PMH listed below who was referred to me for anemia Reviewed patient's recent labs that was done.  Patient has a history of iron deficient anemia.  Most recently on 11/02/2023, CBC showed decreased hemoglobin of 10.3, MCV 74, iron saturation 4, ferritin 4.  TIBC 447. Patient reports feeling very fatigued, with dizziness.  She denies recent chest pain on exertion,  pre-syncopal episodes, or palpitations She had not noticed any recent bleeding such as epistaxis, hematuria or hematochezia.  She reports that menstrual period usually used to be heavy and currently flow is quite light. She denies over the counter NSAID ingestion. She is not on antiplatelets agents. Patient has tried oral iron supplementation unable to tolerate due to GI toxicity-nausea  She is married and has 2 children.  Youngest child is 18 years old.  She has a copper IUD.  MEDICAL HISTORY:  Past Medical History:  Diagnosis Date   Prediabetes     SURGICAL HISTORY: Past Surgical History:  Procedure Laterality Date   PARATHYROIDECTOMY  11/2021   TONSILLECTOMY      SOCIAL HISTORY: Social History   Socioeconomic History   Marital status: Married    Spouse name: Not on file   Number of children: Not on file   Years of education: Not on file   Highest education level: Not  on file  Occupational History   Not on file  Tobacco Use   Smoking status: Never   Smokeless tobacco: Never  Vaping Use   Vaping status: Never Used  Substance and Sexual Activity   Alcohol use: Yes    Comment: occasional   Drug use: Never   Sexual activity: Yes  Other Topics Concern   Not on file  Social History Narrative   Not on file   Social Drivers of Health   Financial Resource Strain: Not on file  Food Insecurity: No Food Insecurity (07/07/2022)   Hunger Vital Sign    Worried About Running Out of Food in the Last Year: Never true    Ran Out of Food in the Last Year: Never true  Transportation  Needs: No Transportation Needs (04/27/2023)   PRAPARE - Administrator, Civil Service (Medical): No    Lack of Transportation (Non-Medical): No  Physical Activity: Not on file  Stress: Not on file  Social Connections: Not on file  Intimate Partner Violence: Not At Risk (07/07/2022)   Humiliation, Afraid, Rape, and Kick questionnaire    Fear of Current or Ex-Partner: No    Emotionally Abused: No    Physically Abused: No    Sexually Abused: No    FAMILY HISTORY: Family History  Problem Relation Age of Onset   Stomach cancer Maternal Grandmother     ALLERGIES:  has no known allergies.  MEDICATIONS:  Current Outpatient Medications  Medication Sig Dispense Refill   clindamycin (CLEOCIN-T) 1 % lotion Apply to aa's hidradenitis QD PRN. 60 mL 5   ferrous sulfate 325 (65 FE) MG EC tablet Take 325 mg by mouth 3 (three) times daily with meals.     ketoconazole (NIZORAL) 2 % shampoo Shampoo into scalp let sit 5-10 minutes then wash out. Use 2-3 days per week. 120 mL 5   metroNIDAZOLE (METROCREAM) 0.75 % cream For acne/rosacea apply to the face QD-BID. 45 g 5   Semaglutide,0.25 or 0.5MG /DOS, 2 MG/1.5ML SOPN Inject 0.25 mg into the skin once a week. 1.5 mL 1   Vitamin D, Ergocalciferol, (DRISDOL) 1.25 MG (50000 UNIT) CAPS capsule Take 1 capsule (50,000 Units total) by mouth every 7 (seven) days. Take for 8 total doses(weeks) 8 capsule 0   doxycycline (MONODOX) 100 MG capsule Take once daily as needed for hidradenitis flare. Take with food and plenty of drink. (Patient not taking: Reported on 11/10/2023) 30 capsule 1   fluocinonide (LIDEX) 0.05 % external solution APPLY 1 TO 2 DROPS TO AFFECTED AREAS ON SCALP ONCE TO TWICE DAILY FOR ITCHING (Patient not taking: Reported on 11/10/2023) 60 mL 0   mometasone (ELOCON) 0.1 % cream Apply to hands QHS and cover with gloves PRN. (Patient not taking: Reported on 11/10/2023) 45 g 1   pantoprazole (PROTONIX) 40 MG tablet Take 1 tablet (40 mg total) by  mouth daily as needed. 30+ minutes prior to dinner on empty stomach. (Patient not taking: Reported on 11/10/2023) 90 tablet 3   Semaglutide-Weight Management 0.25 MG/0.5ML SOAJ Inject 0.25 mg into the skin once a week for 28 days. (Patient not taking: Reported on 11/08/2023) 2 mL 0   [START ON 12/01/2023] Semaglutide-Weight Management 0.5 MG/0.5ML SOAJ Inject 0.5 mg into the skin once a week for 28 days. (Patient not taking: Reported on 11/10/2023) 2 mL 0   tretinoin (RETIN-A) 0.05 % cream Apply topically at bedtime. qhs to face as tolerated for acne (Patient not taking: Reported on 11/02/2023) 45  g 2   WEGOVY 0.25 MG/0.5ML SOAJ Inject 0.25 mg into the skin once a week. (Patient not taking: Reported on 11/02/2023) 2 mL 2   No current facility-administered medications for this visit.    Review of Systems  Constitutional:  Positive for fatigue. Negative for appetite change, chills and fever.  HENT:   Negative for hearing loss and voice change.   Eyes:  Negative for eye problems.  Respiratory:  Negative for chest tightness and cough.   Cardiovascular:  Negative for chest pain.  Gastrointestinal:  Negative for abdominal distention, abdominal pain and blood in stool.  Endocrine: Negative for hot flashes.  Genitourinary:  Negative for difficulty urinating and frequency.   Musculoskeletal:  Negative for arthralgias.  Skin:  Negative for itching and rash.  Neurological:  Positive for dizziness. Negative for extremity weakness.  Hematological:  Negative for adenopathy.  Psychiatric/Behavioral:  Negative for confusion.     PHYSICAL EXAMINATION: Vitals:   11/10/23 1502  BP: 119/78  Pulse: 94  Resp: 18  Temp: 98.2 F (36.8 C)   Filed Weights   11/10/23 1502  Weight: 233 lb 6.4 oz (105.9 kg)    Physical Exam Constitutional:      General: She is not in acute distress. HENT:     Head: Normocephalic and atraumatic.  Eyes:     General: No scleral icterus. Cardiovascular:     Rate and Rhythm:  Normal rate and regular rhythm.     Heart sounds: Normal heart sounds.  Pulmonary:     Effort: Pulmonary effort is normal. No respiratory distress.     Breath sounds: No wheezing.  Abdominal:     General: Bowel sounds are normal. There is no distension.     Palpations: Abdomen is soft.  Musculoskeletal:        General: No deformity. Normal range of motion.     Cervical back: Normal range of motion and neck supple.  Skin:    General: Skin is warm and dry.     Findings: No erythema or rash.  Neurological:     Mental Status: She is alert and oriented to person, place, and time. Mental status is at baseline.     Cranial Nerves: No cranial nerve deficit.     Coordination: Coordination normal.  Psychiatric:        Mood and Affect: Mood normal.      LABORATORY DATA:  I have reviewed the data as listed    Latest Ref Rng & Units 11/02/2023    8:41 AM 04/24/2023    9:13 PM 11/07/2022   10:56 AM  CBC  WBC 3.4 - 10.8 x10E3/uL 5.9  7.2  6.9   Hemoglobin 11.1 - 15.9 g/dL 16.1  09.6  04.5   Hematocrit 34.0 - 46.6 % 35.4  40.2  37.8   Platelets 150 - 450 x10E3/uL 439  473  475       Latest Ref Rng & Units 11/02/2023    8:41 AM 04/24/2023    9:13 PM 11/07/2022   10:56 AM  CMP  Glucose 70 - 99 mg/dL 80  409  89   BUN 6 - 20 mg/dL 9  12  14    Creatinine 0.57 - 1.00 mg/dL 8.11  9.14  7.82   Sodium 134 - 144 mmol/L 140  136  136   Potassium 3.5 - 5.2 mmol/L 4.7  3.5  3.8   Chloride 96 - 106 mmol/L 102  104  105   CO2 20 -  29 mmol/L 21  22  22    Calcium 8.7 - 10.2 mg/dL 9.0  9.1  9.1   Total Protein 6.0 - 8.5 g/dL 7.0  8.8  8.1   Total Bilirubin 0.0 - 1.2 mg/dL <1.6  0.5  0.6   Alkaline Phos 44 - 121 IU/L 126  114  102   AST 0 - 40 IU/L 17  20  17    ALT 0 - 32 IU/L 18  21  18     Lab Results  Component Value Date   IRON 17 (L) 11/02/2023   TIBC 447 11/02/2023   IRONPCTSAT 4 (LL) 11/02/2023   FERRITIN 4 (L) 11/02/2023     RADIOGRAPHIC STUDIES: I have personally reviewed the  radiological images as listed and agreed with the findings in the report. No results found.

## 2023-11-12 ENCOUNTER — Ambulatory Visit: Payer: Medicaid Other | Admitting: Physician Assistant

## 2023-11-16 NOTE — Telephone Encounter (Signed)
Marcelino Scot (Key: [id number])   This request has received a Favorable outcome.

## 2023-11-17 ENCOUNTER — Inpatient Hospital Stay: Payer: Medicaid Other | Attending: Oncology

## 2023-11-17 ENCOUNTER — Encounter: Payer: Self-pay | Admitting: Family Medicine

## 2023-11-17 ENCOUNTER — Inpatient Hospital Stay: Payer: Medicaid Other

## 2023-11-17 VITALS — BP 108/71 | HR 84 | Temp 98.4°F | Resp 16

## 2023-11-17 DIAGNOSIS — D509 Iron deficiency anemia, unspecified: Secondary | ICD-10-CM | POA: Diagnosis not present

## 2023-11-17 DIAGNOSIS — D500 Iron deficiency anemia secondary to blood loss (chronic): Secondary | ICD-10-CM

## 2023-11-17 MED ORDER — IRON SUCROSE 20 MG/ML IV SOLN
200.0000 mg | Freq: Once | INTRAVENOUS | Status: AC
  Administered 2023-11-17: 200 mg via INTRAVENOUS
  Filled 2023-11-17: qty 10

## 2023-11-17 NOTE — Patient Instructions (Signed)

## 2023-11-19 ENCOUNTER — Encounter: Payer: Self-pay | Admitting: Physician Assistant

## 2023-11-19 ENCOUNTER — Ambulatory Visit (INDEPENDENT_AMBULATORY_CARE_PROVIDER_SITE_OTHER): Payer: Medicaid Other | Admitting: Physician Assistant

## 2023-11-19 VITALS — BP 110/68 | HR 103 | Temp 98.2°F | Ht 63.0 in | Wt 233.0 lb

## 2023-11-19 DIAGNOSIS — Z6841 Body Mass Index (BMI) 40.0 and over, adult: Secondary | ICD-10-CM | POA: Diagnosis not present

## 2023-11-19 DIAGNOSIS — E66813 Obesity, class 3: Secondary | ICD-10-CM

## 2023-11-19 NOTE — Progress Notes (Signed)
 Date:  11/19/2023   Name:  Kara House   DOB:  August 08, 2001   MRN:  [id number]   Chief Complaint: Obesity  HPI Kara House a very pleasant 23 year old female with class 3 obesity, prediabetes, vitamin D  deficiency, iron  deficiency, and HS who presents new to me today for nutrition consult, typically seen by my colleague Dr. Selinda Ku who has recently started her on Wegovy .  She has completed two shots at the 0.25 mg dose, reports good appetite suppression and minimal side effects.  By our scale she has lost 6-7 pounds since her last visit just over 2 weeks ago.  In addition, she has made some important changes to her diet to include more vegetables (half the plate is vegetables each meal).  With regard to physical activity, she has 3 children at home so it is sometimes difficult to exercise, but she tries to do as much as she can with them including walking and body weight exercises like squats at home.  Minimal calories from beverages such as juice, soda, alcohol.   Medication list has been reviewed and updated.  Current Meds  Medication Sig   clindamycin  (CLEOCIN -T) 1 % lotion Apply to aa's hidradenitis QD PRN.   doxycycline  (MONODOX ) 100 MG capsule Take once daily as needed for hidradenitis flare. Take with food and plenty of drink.   fluocinonide  (LIDEX ) 0.05 % external solution APPLY 1 TO 2 DROPS TO AFFECTED AREAS ON SCALP ONCE TO TWICE DAILY FOR ITCHING   ketoconazole  (NIZORAL ) 2 % shampoo Shampoo into scalp let sit 5-10 minutes then wash out. Use 2-3 days per week.   metroNIDAZOLE  (METROCREAM ) 0.75 % cream For acne/rosacea apply to the face QD-BID.   mometasone  (ELOCON ) 0.1 % cream Apply to hands QHS and cover with gloves PRN.   pantoprazole  (PROTONIX ) 40 MG tablet Take 1 tablet (40 mg total) by mouth daily as needed. 30+ minutes prior to dinner on empty stomach.   tretinoin  (RETIN-A ) 0.05 % cream Apply topically at bedtime. qhs to face as tolerated for acne   Vitamin D ,  Ergocalciferol , (DRISDOL ) 1.25 MG (50000 UNIT) CAPS capsule Take 1 capsule (50,000 Units total) by mouth every 7 (seven) days. Take for 8 total doses(weeks)   WEGOVY  0.25 MG/0.5ML SOAJ Inject 0.25 mg into the skin once a week.   [DISCONTINUED] Semaglutide ,0.25 or 0.5MG /DOS, 2 MG/1.5ML SOPN Inject 0.25 mg into the skin once a week.     Review of Systems  Patient Active Problem List   Diagnosis Date Noted   Healthcare maintenance 11/02/2023   Menorrhagia with regular cycle 04/29/2023   Iron  deficiency anemia due to chronic blood loss 04/29/2023   Encounter for weight management 10/29/2022   Hidradenitis suppurativa 07/07/2022   Gastroesophageal reflux disease with esophagitis without hemorrhage 07/07/2022   IUD (intrauterine device) in place 02/26/2022   Hyperparathyroidism, primary (HCC) 10/22/2021   Elevated alkaline phosphatase level 10/02/2021   Hypercalcemia 06/12/2021   BMI 39.0-39.9,adult 02/16/2020    No Known Allergies  Immunization History  Administered Date(s) Administered   Influenza Inj Mdck Quad Pf 08/06/2019   Influenza, Seasonal, Injecte, Preservative Fre 08/27/2023   Influenza,inj,Quad PF,6+ Mos 08/12/2021   Influenza-Unspecified 08/06/2019   MMR 03/30/2019   Tdap 02/02/2020, 09/10/2021    Past Surgical History:  Procedure Laterality Date   PARATHYROIDECTOMY  11/2021   TONSILLECTOMY      Social History   Tobacco Use   Smoking status: Never   Smokeless tobacco: Never  Vaping Use   Vaping status:  Never Used  Substance Use Topics   Alcohol use: Yes    Comment: occasional   Drug use: Never    Family History  Problem Relation Age of Onset   Stomach cancer Maternal Grandmother         11/02/2023    8:05 AM 04/29/2023    8:09 AM 10/29/2022    8:08 AM 07/07/2022    3:25 PM  GAD 7 : Generalized Anxiety Score  Nervous, Anxious, on Edge 0 0 0 0  Control/stop worrying 0 0 0 0  Worry too much - different things 0 0 0 0  Trouble relaxing 0 0 0 2   Restless 0 0 0 0  Easily annoyed or irritable 1 0 0 0  Afraid - awful might happen 0 0 0 0  Total GAD 7 Score 1 0 0 2  Anxiety Difficulty Not difficult at all Not difficult at all Not difficult at all        11/02/2023    8:05 AM 04/29/2023    8:08 AM 10/29/2022    8:08 AM  Depression screen PHQ 2/9  Decreased Interest 2 0 0  Down, Depressed, Hopeless 0 0 0  PHQ - 2 Score 2 0 0  Altered sleeping 1 0 1  Tired, decreased energy 2 1 1   Change in appetite 2 0 0  Feeling bad or failure about yourself  0 0 0  Trouble concentrating 1 0 0  Moving slowly or fidgety/restless 0 0 0  Suicidal thoughts 0 0 0  PHQ-9 Score 8 1 2   Difficult doing work/chores Not difficult at all Somewhat difficult Not difficult at all    BP Readings from Last 3 Encounters:  11/19/23 110/68  11/17/23 108/71  11/10/23 119/78    Wt Readings from Last 3 Encounters:  11/19/23 233 lb (105.7 kg)  11/10/23 233 lb 6.4 oz (105.9 kg)  11/02/23 239 lb 9.6 oz (108.7 kg)    BP 110/68   Pulse (!) 103   Temp 98.2 F (36.8 C)   Ht 5' 3 (1.6 m)   Wt 233 lb (105.7 kg)   SpO2 97%   BMI 41.27 kg/m   Physical Exam Vitals and nursing note reviewed.  Constitutional:      Appearance: Normal appearance. She is obese.  Cardiovascular:     Rate and Rhythm: Normal rate.  Pulmonary:     Effort: Pulmonary effort is normal.  Abdominal:     General: There is no distension.  Musculoskeletal:        General: Normal range of motion.  Skin:    General: Skin is warm and dry.  Neurological:     Mental Status: She is alert and oriented to person, place, and time.     Gait: Gait is intact.  Psychiatric:        Mood and Affect: Mood and affect normal.     Recent Labs     Component Value Date/Time   NA 140 11/02/2023 0841   K 4.7 11/02/2023 0841   CL 102 11/02/2023 0841   CO2 21 11/02/2023 0841   GLUCOSE 80 11/02/2023 0841   GLUCOSE 108 (H) 04/24/2023 2113   BUN 9 11/02/2023 0841   CREATININE 0.50 (L)  11/02/2023 0841   CALCIUM 9.0 11/02/2023 0841   PROT 7.0 11/02/2023 0841   ALBUMIN 4.0 11/02/2023 0841   AST 17 11/02/2023 0841   ALT 18 11/02/2023 0841   ALKPHOS 126 (H) 11/02/2023 0841   BILITOT <0.2 11/02/2023  9158   GFRNONAA >60 04/24/2023 2113   GFRAA >60 06/19/2019 2244    Lab Results  Component Value Date   WBC 5.9 11/02/2023   HGB 10.3 (L) 11/02/2023   HCT 35.4 11/02/2023   MCV 74 (L) 11/02/2023   PLT 439 11/02/2023   Lab Results  Component Value Date   HGBA1C 5.8 (H) 11/02/2023   Lab Results  Component Value Date   CHOL 127 11/02/2023   HDL 35 (L) 11/02/2023   LDLCALC 69 11/02/2023   TRIG 126 11/02/2023   CHOLHDL 3.6 11/02/2023   Lab Results  Component Value Date   TSH 1.710 11/02/2023     Assessment and Plan:  1. Class 3 severe obesity with serious comorbidity and body mass index (BMI) of 40.0 to 44.9 in adult, unspecified obesity type (HCC) (Primary) Patient is doing well with Wegovy .  Continue at the prescribed dose, has refill available for 0.5 mg when missed with this month. Has f/y with Dr. Alvia 12/31/23.  Advised the importance of adequate protein intake on this medication as well as resistance training which will not only improve insulin sensitivity but also build muscle mass and reduce risk of atrophy.  Advised importance of portion control and slower eating on this medication to minimize GI side effects. Avoid high fat foods as these may cause discomfort. Patient aware of potential for weight regain when eventually stopping the medication. For this reason, continued efforts toward improving lifestyle will be particularly important moving forward.   Recommend diet low in simple carbs such as white starches (bread, pasta, rice) and refined sugar found in desserts and sweetened beverages including juice, sweet tea, and soda. Encouraged drinking mainly water, flavoring naturally with lemon, cucumber, berries etc or with artificial sweetener if needed for  convenience, though excess artificial sweetener is not ideal either.   We discussed the value of good nutrition for energy, digestion, and metabolism. Encouraged consumption of whole fruits and vegetables as a staple in the diet, alongside well-balanced meals with healthy sources of protein - preferably plant-based. Modifying diet is a process, and any progress made should be viewed as a victory.   Recommend routine physical activity which can start small (10 min daily walk) with eventual goal of 150 min of heart rate elevation per week. Examples could include brisk walk, stationary bike, etc. At least 3x per week, I recommend some type of resistance training which could be weights, resistance bands, or body weight exercises - this would count toward the weekly exercise goal.     F/u PRN   Rolan Hoyle, PA-C, DMSc, Nutritionist [facility] [phone]

## 2023-11-24 ENCOUNTER — Inpatient Hospital Stay: Payer: Medicaid Other

## 2023-11-24 VITALS — BP 122/77 | HR 88 | Temp 97.5°F | Resp 18

## 2023-11-24 DIAGNOSIS — D509 Iron deficiency anemia, unspecified: Secondary | ICD-10-CM | POA: Diagnosis not present

## 2023-11-24 DIAGNOSIS — D500 Iron deficiency anemia secondary to blood loss (chronic): Secondary | ICD-10-CM

## 2023-11-24 MED ORDER — SODIUM CHLORIDE 0.9% FLUSH
10.0000 mL | Freq: Once | INTRAVENOUS | Status: AC | PRN
  Administered 2023-11-24: 10 mL
  Filled 2023-11-24: qty 10

## 2023-11-24 MED ORDER — IRON SUCROSE 20 MG/ML IV SOLN
200.0000 mg | Freq: Once | INTRAVENOUS | Status: AC
  Administered 2023-11-24: 200 mg via INTRAVENOUS

## 2023-11-25 ENCOUNTER — Ambulatory Visit (INDEPENDENT_AMBULATORY_CARE_PROVIDER_SITE_OTHER): Payer: Medicaid Other

## 2023-11-25 VITALS — BP 113/78 | HR 89 | Ht 63.0 in | Wt 234.0 lb

## 2023-11-25 DIAGNOSIS — N920 Excessive and frequent menstruation with regular cycle: Secondary | ICD-10-CM | POA: Diagnosis not present

## 2023-11-25 DIAGNOSIS — Z7689 Persons encountering health services in other specified circumstances: Secondary | ICD-10-CM | POA: Diagnosis not present

## 2023-11-25 NOTE — Progress Notes (Signed)
   GYN ENCOUNTER  Encounter for heavy menstrual bleeding  Subjective  HPI: Kara House is a 23 y.o. [id number] who presents today for evaluation of heavy menstrual bleeding.   Saw PCP who diagnosed her with IDA and told her it may be related to menstrual bleeding and she should follow up with gyn.    Having regular periods every 28 days, sometimes heavy, sometimes light. With last period, second day was heavy, needed to change pad and tampon every time she used the bathroom, every 1-2 hours. Then on days 3-5, mostly spotting.   Copper IUD in place since 2023. Has had normal periods since that time.   Past Medical History:  Diagnosis Date   Prediabetes    Past Surgical History:  Procedure Laterality Date   PARATHYROIDECTOMY  11/2021   TONSILLECTOMY     OB History     Gravida  3   Para  3   Term  3   Preterm      AB      Living  3      SAB      IAB      Ectopic      Multiple      Live Births  3          No Known Allergies  Review of Systems  12 point ROS negative except for pertinent positives noted in HPO above.   Objective  BP 113/78   Pulse 89   Ht 5\' 3"  (1.6 m)   Wt 234 lb (106.1 kg)   LMP 11/21/2023 (Approximate)   BMI 41.45 kg/m   Physical examination GENERAL APPEARANCE: alert, well appearing LUNGS: normal work of breathing HEART: normal heart rate  Assessment/Plan - Reviewed normal levels of bleeding with period and that her description sounded within normal expectations.  - Discussed use of NSAIDs for managing heavy bleeding. Recommended starting ibuprofen around the clock the day before her period and continuing around the clock for first 2-3 days.  - Recommended follow up with increased bleeding or if NSAIDs do not provide enough relief. Reviewed TXA as a possible option in this case.   Lindalou Hose Nathaneal Sommers, CNM  11/25/23 2:27 PM

## 2023-12-01 ENCOUNTER — Inpatient Hospital Stay: Payer: Medicaid Other

## 2023-12-03 ENCOUNTER — Inpatient Hospital Stay: Payer: Medicaid Other

## 2023-12-03 VITALS — BP 121/79 | HR 86 | Temp 98.7°F | Resp 16

## 2023-12-03 DIAGNOSIS — D509 Iron deficiency anemia, unspecified: Secondary | ICD-10-CM | POA: Diagnosis not present

## 2023-12-03 DIAGNOSIS — D500 Iron deficiency anemia secondary to blood loss (chronic): Secondary | ICD-10-CM

## 2023-12-03 MED ORDER — SODIUM CHLORIDE 0.9% FLUSH
10.0000 mL | Freq: Once | INTRAVENOUS | Status: AC | PRN
  Administered 2023-12-03: 10 mL
  Filled 2023-12-03: qty 10

## 2023-12-03 MED ORDER — IRON SUCROSE 20 MG/ML IV SOLN
200.0000 mg | Freq: Once | INTRAVENOUS | Status: AC
  Administered 2023-12-03: 200 mg via INTRAVENOUS
  Filled 2023-12-03: qty 10

## 2023-12-03 NOTE — Patient Instructions (Signed)

## 2023-12-03 NOTE — Progress Notes (Signed)
 Declined post-observation. Aware of risks. Vitals stable at discharge.

## 2023-12-08 ENCOUNTER — Inpatient Hospital Stay: Payer: Medicaid Other

## 2023-12-08 VITALS — BP 103/72 | HR 90 | Temp 97.8°F | Resp 18

## 2023-12-08 DIAGNOSIS — D500 Iron deficiency anemia secondary to blood loss (chronic): Secondary | ICD-10-CM

## 2023-12-08 DIAGNOSIS — D509 Iron deficiency anemia, unspecified: Secondary | ICD-10-CM | POA: Diagnosis not present

## 2023-12-08 MED ORDER — IRON SUCROSE 20 MG/ML IV SOLN
200.0000 mg | Freq: Once | INTRAVENOUS | Status: AC
  Administered 2023-12-08: 200 mg via INTRAVENOUS

## 2023-12-08 MED ORDER — SODIUM CHLORIDE 0.9% FLUSH
10.0000 mL | Freq: Once | INTRAVENOUS | Status: AC | PRN
Start: 2023-12-08 — End: 2023-12-08
  Administered 2023-12-08: 10 mL
  Filled 2023-12-08: qty 10

## 2023-12-30 ENCOUNTER — Emergency Department

## 2023-12-30 ENCOUNTER — Encounter: Payer: Self-pay | Admitting: Emergency Medicine

## 2023-12-30 ENCOUNTER — Other Ambulatory Visit: Payer: Self-pay

## 2023-12-30 ENCOUNTER — Emergency Department
Admission: EM | Admit: 2023-12-30 | Discharge: 2023-12-30 | Disposition: A | Discharge status: Home / Self Care | Attending: Emergency Medicine | Admitting: Emergency Medicine

## 2023-12-30 DIAGNOSIS — R0789 Other chest pain: Secondary | ICD-10-CM | POA: Diagnosis not present

## 2023-12-30 DIAGNOSIS — J101 Influenza due to other identified influenza virus with other respiratory manifestations: Secondary | ICD-10-CM | POA: Insufficient documentation

## 2023-12-30 DIAGNOSIS — R0602 Shortness of breath: Secondary | ICD-10-CM | POA: Diagnosis not present

## 2023-12-30 DIAGNOSIS — U071 COVID-19: Secondary | ICD-10-CM | POA: Diagnosis not present

## 2023-12-30 DIAGNOSIS — R059 Cough, unspecified: Secondary | ICD-10-CM

## 2023-12-30 DIAGNOSIS — J111 Influenza due to unidentified influenza virus with other respiratory manifestations: Secondary | ICD-10-CM

## 2023-12-30 DIAGNOSIS — R509 Fever, unspecified: Secondary | ICD-10-CM | POA: Diagnosis not present

## 2023-12-30 DIAGNOSIS — R079 Chest pain, unspecified: Secondary | ICD-10-CM | POA: Diagnosis not present

## 2023-12-30 LAB — BASIC METABOLIC PANEL
Anion gap: 7 (ref 5–15)
BUN: 7 mg/dL (ref 6–20)
CO2: 22 mmol/L (ref 22–32)
Calcium: 8.5 mg/dL — ABNORMAL LOW (ref 8.9–10.3)
Chloride: 104 mmol/L (ref 98–111)
Creatinine, Ser: 0.55 mg/dL (ref 0.44–1.00)
GFR, Estimated: >60 mL/min (ref >60)
Glucose, Bld: 92 mg/dL (ref 70–99)
Potassium: 3.6 mmol/L (ref 3.5–5.1)
Sodium: 133 mmol/L — ABNORMAL LOW (ref 135–145)

## 2023-12-30 LAB — CBC
HCT: 41.5 % (ref 36.0–46.0)
Hemoglobin: 12.9 g/dL (ref 12.0–15.0)
MCH: 24.4 pg — ABNORMAL LOW (ref 26.0–34.0)
MCHC: 31.1 g/dL (ref 30.0–36.0)
MCV: 78.6 fL — ABNORMAL LOW (ref 80.0–100.0)
Platelets: 347 10*3/uL (ref 150–400)
RBC: 5.28 MIL/uL — ABNORMAL HIGH (ref 3.87–5.11)
RDW: 21 % — ABNORMAL HIGH (ref 11.5–15.5)
WBC: 4.6 10*3/uL (ref 4.0–10.5)
nRBC: 0 % (ref 0.0–0.2)

## 2023-12-30 LAB — HCG, QUANTITATIVE, PREGNANCY: hCG, Beta Chain, Quant, S: <1 m[IU]/mL (ref <5)

## 2023-12-30 LAB — TROPONIN I (HIGH SENSITIVITY)
Troponin I (High Sensitivity): <2 ng/L (ref <18)
Troponin I (High Sensitivity): 3 ng/L (ref <18)

## 2023-12-30 LAB — D-DIMER, QUANTITATIVE: D-Dimer, Quant: <0.27 ug{FEU}/mL (ref 0.00–0.50)

## 2023-12-30 LAB — RESP PANEL BY RT-PCR (RSV, FLU A&B, COVID)  RVPGX2
Influenza A by PCR: POSITIVE — AB
Influenza B by PCR: NEGATIVE
Resp Syncytial Virus by PCR: NEGATIVE
SARS Coronavirus 2 by RT PCR: POSITIVE — AB

## 2023-12-30 MED ORDER — LACTATED RINGERS IV BOLUS
1000.0000 mL | Freq: Once | INTRAVENOUS | Status: AC
  Administered 2023-12-30: 1000 mL via INTRAVENOUS

## 2023-12-30 MED ORDER — ACETAMINOPHEN 500 MG PO TABS
1000.0000 mg | ORAL_TABLET | Freq: Once | ORAL | Status: AC
  Administered 2023-12-30: 1000 mg via ORAL
  Filled 2023-12-30: qty 2

## 2023-12-30 NOTE — ED Provider Notes (Signed)
 [facility] Provider Note    Event Date/Time   First MD Initiated Contact with Patient 12/30/23 1501     (approximate)   History   Chest Pain   HPI  Kara House is a 23 y.o. female  with h/o iron deficiency anemia here for cough, chest pain and SOB with cough, lightheadedness. Also with headache. Symptoms started on Friday. Noted green sputum in cough, then had some blood in her sputum today. No h/o blood clots, OCP use, recent travel/surgeries, unilateral calf swelling or tenderness. No N/V/D. No weakness, numbness, speech difficulties, neck stiffness. Does have body aches. Pt does have an IUD.   On independent chart review she was seen by OBGYN on 12/05/23, has copper IUD since 2023. Also received iron infusion for anemia on 12/08/23.     Physical Exam   Triage Vital Signs: ED Triage Vitals [12/30/23 1235]  Encounter Vitals Group     BP 122/82     Systolic BP Percentile      Diastolic BP Percentile      Pulse Rate (!) 125     Resp 18     Temp 100.1 F (37.8 C)     Temp Source Oral     SpO2 99 %     Weight 233 lb 14.5 oz (106.1 kg)     Height 5\' 3"  (1.6 m)     Head Circumference      Peak Flow      Pain Score 10     Pain Loc      Pain Education      Exclude from Growth Chart     Most recent vital signs: Vitals:   12/30/23 1532 12/30/23 1622  BP: 120/78 118/80  Pulse: (!) 120 (!) 110  Resp: 18 18  Temp:  99.9 F (37.7 C)  SpO2:  99%     General: Awake, no distress.  CV:  Good peripheral perfusion.  Resp:  Normal effort. No increased WOB or resp distress Abd:  No distention. Soft nontender Other:  No BLE edema, no unilateral calf swelling/tenderness. No neck stiffness, moving all 4 extremities without focal weakness. Non-toxic appearing   ED Results / Procedures / Treatments   Labs (all labs ordered are listed, but only abnormal results are displayed) Labs Reviewed  RESP PANEL BY RT-PCR (RSV, FLU A&B, COVID)  RVPGX2 -  Abnormal; Notable for the following components:      Result Value   SARS Coronavirus 2 by RT PCR POSITIVE (*)    Influenza A by PCR POSITIVE (*)    All other components within normal limits  BASIC METABOLIC PANEL - Abnormal; Notable for the following components:   Sodium 133 (*)    Calcium 8.5 (*)    All other components within normal limits  CBC - Abnormal; Notable for the following components:   RBC 5.28 (*)    MCV 78.6 (*)    MCH 24.4 (*)    RDW 21.0 (*)    All other components within normal limits  HCG, QUANTITATIVE, PREGNANCY  D-DIMER, QUANTITATIVE  POC URINE PREG, ED  TROPONIN I (HIGH SENSITIVITY)  TROPONIN I (HIGH SENSITIVITY)     EKG  Sinus tachycardia, rate of 127, normal QRS, normal Qtc, no ischemic ST elevation, TWI III, no sig changed compared to prior   RADIOLOGY Chest x-ray on my interpretation without obvious consolidation   PROCEDURES:  Critical Care performed: No  Procedures   MEDICATIONS ORDERED IN ED: Medications  acetaminophen (TYLENOL) tablet 1,000 mg (1,000 mg Oral Given 12/30/23 1552)  lactated ringers bolus 1,000 mL (1,000 mLs Intravenous New Bag/Given 12/30/23 1551)     IMPRESSION / MDM / ASSESSMENT AND PLAN / ED COURSE  I reviewed the triage vital signs and the nursing notes.                              Differential diagnosis includes, but is not limited to, PNA, viral illness, Covid, rsv, influenza, myocarditis, ACS, considered PE but pt is not hypoxic, no unilateral calf swelling or tenderness, sxs more consistent with infection, given blood in sputum, will get d-dimer, labs, EKG, trop, cxr. IVF, tylenol.  Patient's presentation is most consistent with acute presentation with potential threat to life or bodily function.  Independent review of labs and imaging are below.  On reassessment patient is feeling lot better after fluids and Tylenol.  Considered but no indication for inpatient admission at this time, she is safe for outpatient  management.  Shared decision making done with patient and she is agreeable with plan for discharge.  Instructed her to keep herself hydrated, she can use Pedialyte, Gatorade, also that she can take ibuprofen or Tylenol as needed for the body aches.  Instructed her to also follow-up with her primary care doctor for further management outpatient.  Strict return precautions given.  Discharged.  Clinical Course as of 12/30/23 1708  Thu Dec 30, 2023  1702 Independent review of labs, ECG is negative, troponin is negative, dimer is negative, no leukocytosis, electrolytes not severely deranged, creatinine is normal, she is COVID and flu positive [TT]  1703 DG Chest 2 View No active cardiopulmonary disease.  [TT]    Clinical Course User Index [TT] Jodie Echevaria, Franchot Erichsen, MD     FINAL CLINICAL IMPRESSION(S) / ED DIAGNOSES   Final diagnoses:  Chest wall pain  COVID  Influenza  Cough, unspecified type     Rx / DC Orders   ED Discharge Orders     None        Note:  This document was prepared using Dragon voice recognition software and may include unintentional dictation errors.    Claybon Jabs, MD 12/30/23 [phone]

## 2023-12-30 NOTE — ED Triage Notes (Signed)
 Pt here with cough, dizziness, and chest pain since Monday. Pt also c/o bad headache. Pt states she has a productive cough with some blood. Pt denies NVD. Pt states her cp is more left sided.

## 2023-12-30 NOTE — Discharge Instructions (Signed)
 You can take 650 mg of Tylenol or 400 mg of ibuprofen every 6 hours as needed for muscle aching.

## 2023-12-30 NOTE — ED Provider Triage Note (Signed)
 Emergency Medicine Provider Triage Evaluation Note  Kara House , a 23 y.o. female  was evaluated in triage.  Pt complains of headache, dizziness, chest pain, shortness of breath, and fever. Symptoms started 3 days ago.   Physical Exam  BP 122/82   Pulse (!) 125   Temp 100.1 F (37.8 C) (Oral)   Resp 18   Ht 5\' 3"  (1.6 m)   Wt 106.1 kg   SpO2 99%   BMI 41.43 kg/m  Gen:   Awake, no distress   Resp:  Normal effort  MSK:   Moves extremities without difficulty  Other:    Medical Decision Making  Medically screening exam initiated at 12:43 PM.  Appropriate orders placed.  Kara House was informed that the remainder of the evaluation will be completed by another provider, this initial triage assessment does not replace that evaluation, and the importance of remaining in the ED until their evaluation is complete.  Protocols and respiratory panel obtained.   Kara Pester, FNP 12/30/23 1244

## 2023-12-31 ENCOUNTER — Ambulatory Visit: Payer: Self-pay | Admitting: Family Medicine

## 2024-01-05 ENCOUNTER — Ambulatory Visit: Admitting: Family Medicine

## 2024-01-10 ENCOUNTER — Ambulatory Visit: Admitting: Family Medicine

## 2024-01-12 ENCOUNTER — Ambulatory Visit (INDEPENDENT_AMBULATORY_CARE_PROVIDER_SITE_OTHER): Admitting: Family Medicine

## 2024-01-12 ENCOUNTER — Encounter: Payer: Self-pay | Admitting: Family Medicine

## 2024-01-12 VITALS — HR 90 | Ht 63.0 in | Wt 234.2 lb

## 2024-01-12 DIAGNOSIS — J302 Other seasonal allergic rhinitis: Secondary | ICD-10-CM | POA: Insufficient documentation

## 2024-01-12 DIAGNOSIS — Z6841 Body Mass Index (BMI) 40.0 and over, adult: Secondary | ICD-10-CM | POA: Diagnosis not present

## 2024-01-12 DIAGNOSIS — E66813 Obesity, class 3: Secondary | ICD-10-CM | POA: Diagnosis not present

## 2024-01-12 MED ORDER — SEMAGLUTIDE-WEIGHT MANAGEMENT 0.25 MG/0.5ML ~~LOC~~ SOAJ: 0.2500 mg | SUBCUTANEOUS | 0 refills | Status: AC

## 2024-01-12 MED ORDER — BENZONATATE 100 MG PO CAPS
100.0000 mg | ORAL_CAPSULE | Freq: Two times a day (BID) | ORAL | 0 refills | Status: DC | PRN
Start: 2024-01-12 — End: 2024-07-25

## 2024-01-12 MED ORDER — SEMAGLUTIDE-WEIGHT MANAGEMENT 0.5 MG/0.5ML ~~LOC~~ SOAJ: 0.5000 mg | SUBCUTANEOUS | 1 refills | Status: AC

## 2024-01-12 MED ORDER — AZELASTINE HCL 0.1 % NA SOLN: 2.0000 | Freq: Two times a day (BID) | NASAL | 1 refills | Status: DC

## 2024-01-12 NOTE — Progress Notes (Signed)
 Primary Care / Sports Medicine Office Visit  Patient Information:  Patient ID: Kara House, female DOB: [birth year] Age: 23 y.o. MRN: [id number]   Kara House is a pleasant 23 y.o. female presenting with the following:  Chief Complaint  Patient presents with   Weight Check    Patient was taking wegovy for 8 weeks until she came down with Covid and FLU. When she was on the wegovy she was feeling good and sis not have any side effects.     Vitals:   01/12/24 0805  Pulse: 90  SpO2: 98%   Vitals:   01/12/24 0805  Weight: 234 lb 3.2 oz (106.2 kg)  Height: 5\' 3"  (1.6 m)   Body mass index is 41.49 kg/m.     Independent interpretation of notes and tests performed by another provider:   None  Procedures performed:   None  Pertinent History, Exam, Impression, and Recommendations:   Problem List Items Addressed This Visit     Class 3 severe obesity with serious comorbidity and body mass index (BMI) of 40.0 to 44.9 in adult [facility])   Regarding weight management, she was previously on Wegovy and experienced no significant side effects. She is considering resuming the medication once she feels better. She denies any current stomach issues and uses pantoprazole as needed.  Weight management with Wegovy Previously on Wegovy 0.25 mg with no adverse symptoms. Advised to delay restarting until recovery from current illness. Monitor for nausea, appetite changes, and bowel habits with dose adjustments. Emphasized importance of appetite reduction to avoid nausea from overeating. - Send prescription for [facility]. - Advise to wait approximately one-two weeks before restarting Wegovy once feeling better. - Monitor for nausea, appetite changes, and bowel habits when increasing to the 0.5 mg dose after completing 4 weeks of the 0.25 mg dose. - Schedule follow-up visit in three months after restarting Wegovy.      Relevant Medications   Semaglutide-Weight Management 0.25 MG/0.5ML SOAJ    Semaglutide-Weight Management 0.5 MG/0.5ML SOAJ (Start on 02/10/2024)   Seasonal allergic rhinitis - Primary   Kara House is a 23 year old female who presents with persistent throat pain and cough following a recent COVID-19 and flu infection.  She has been experiencing persistent throat pain and voice changes following a COVID-19 and flu infection that occurred one to two months ago. Her voice is described as 'good' during the day but becomes a 'whisper' by night. She is not currently taking any medication for these symptoms. No facial congestion or pressure is reported, but she mentions a sensation of her eyes wanting to close. No ear issues are present, and her breathing has improved, although she previously experienced significant chest pain.  Physical Exam VITALS: SaO2- 98% HEENT: Benign. CHEST: Lungs clear to auscultation bilaterally.  Allergic Rhinitis Persistent sore throat and intermittent hoarseness following recent COVID-19 and influenza infection. No congestion or facial pressure. Breathing improved with oxygen saturation at 98%. - Recommend daily oral antihistamine such as Claritin, Zyrtec, or Allegra. Avoid Benadryl due to drowsiness. - Advise daily use of nasal steroid such as Flonase, Rhinocort, or Nasacort. - Prescribe Astelin nasal spray, two sprays twice daily, if symptoms persist. - Prescribe Tessalon pearls for cough, to be used as needed, especially at night. - Advise use of saline nasal spray before applying nasal steroids to clear secretions.      Relevant Medications   azelastine (ASTELIN) 0.1 % nasal spray   benzonatate (TESSALON) 100 MG capsule  Orders & Medications Medications:  Meds ordered this encounter  Medications   Semaglutide-Weight Management 0.25 MG/0.5ML SOAJ    Sig: Inject 0.25 mg into the skin once a week for 28 days.    Dispense:  2 mL    Refill:  0   Semaglutide-Weight Management 0.5 MG/0.5ML SOAJ    Sig: Inject 0.5 mg into the  skin once a week.    Dispense:  2 mL    Refill:  1   azelastine (ASTELIN) 0.1 % nasal spray    Sig: Place 2 sprays into both nostrils 2 (two) times daily. Use in each nostril as directed    Dispense:  30 mL    Refill:  1    Use generic Astelin   benzonatate (TESSALON) 100 MG capsule    Sig: Take 1 capsule (100 mg total) by mouth 2 (two) times daily as needed for cough.    Dispense:  20 capsule    Refill:  0   No orders of the defined types were placed in this encounter.    Return in about 3 months (around 04/12/2024) for Weight MGMT f/u.     Jerrol Banana, MD, [facility]   Primary Care Sports Medicine Primary Care and Sports Medicine at [facility]

## 2024-01-12 NOTE — Assessment & Plan Note (Signed)
 Kara House is a 23 year old female who presents with persistent throat pain and cough following a recent COVID-19 and flu infection.  She has been experiencing persistent throat pain and voice changes following a COVID-19 and flu infection that occurred one to two months ago. Her voice is described as 'good' during the day but becomes a 'whisper' by night. She is not currently taking any medication for these symptoms. No facial congestion or pressure is reported, but she mentions a sensation of her eyes wanting to close. No ear issues are present, and her breathing has improved, although she previously experienced significant chest pain.  Physical Exam VITALS: SaO2- 98% HEENT: Benign. CHEST: Lungs clear to auscultation bilaterally.  Allergic Rhinitis Persistent sore throat and intermittent hoarseness following recent COVID-19 and influenza infection. No congestion or facial pressure. Breathing improved with oxygen saturation at 98%. - Recommend daily oral antihistamine such as Claritin, Zyrtec, or Allegra. Avoid Benadryl due to drowsiness. - Advise daily use of nasal steroid such as Flonase, Rhinocort, or Nasacort. - Prescribe Astelin nasal spray, two sprays twice daily, if symptoms persist. - Prescribe Tessalon pearls for cough, to be used as needed, especially at night. - Advise use of saline nasal spray before applying nasal steroids to clear secretions.

## 2024-01-12 NOTE — Assessment & Plan Note (Signed)
 Regarding weight management, she was previously on Wegovy and experienced no significant side effects. She is considering resuming the medication once she feels better. She denies any current stomach issues and uses pantoprazole as needed.  Weight management with Wegovy Previously on Wegovy 0.25 mg with no adverse symptoms. Advised to delay restarting until recovery from current illness. Monitor for nausea, appetite changes, and bowel habits with dose adjustments. Emphasized importance of appetite reduction to avoid nausea from overeating. - Send prescription for [facility]. - Advise to wait approximately one-two weeks before restarting Wegovy once feeling better. - Monitor for nausea, appetite changes, and bowel habits when increasing to the 0.5 mg dose after completing 4 weeks of the 0.25 mg dose. - Schedule follow-up visit in three months after restarting Wegovy.

## 2024-01-12 NOTE — Patient Instructions (Signed)
 Patient Plan for Post-Visit Guidance  1. Weight Management with ZOXWRU:    - A prescription for [facility] has been sent.    - Wait approximately 1-2 weeks before restarting Wegovy once you feel better.    - Begin with 0.25 mg dose and monitor for nausea, appetite changes, and bowel habits.    - After completing 4 weeks on 0.25 mg, increase to 0.5 mg and continue monitoring.    - Schedule a follow-up visit in three months after restarting Wegovy.  2. Managing Allergic Rhinitis and Post-Infection Symptoms:    - Take a daily oral antihistamine such as Claritin, Zyrtec, or Allegra (avoid Benadryl due to possible drowsiness).    - Use a daily nasal steroid like Flonase, Rhinocort, or Nasacort.    - If symptoms persist, use Astelin nasal spray, two sprays twice daily.    - For cough, use Tessalon pearls as needed, especially at night.    - Use saline nasal spray before applying nasal steroids to help clear secretions.  3. Important Symptoms to Watch For:    - Monitor for increased nausea, significant changes in appetite, or bowel habit changes. If these occur, contact your healthcare provider.    - If your sore throat, hoarseness, or cough worsens, or if you experience new symptoms, please reach out to your healthcare provider.

## 2024-02-08 ENCOUNTER — Inpatient Hospital Stay: Payer: Medicaid Other

## 2024-02-09 ENCOUNTER — Inpatient Hospital Stay: Payer: Medicaid Other

## 2024-02-09 ENCOUNTER — Inpatient Hospital Stay: Payer: Medicaid Other | Admitting: Oncology

## 2024-02-15 ENCOUNTER — Inpatient Hospital Stay

## 2024-02-16 ENCOUNTER — Inpatient Hospital Stay

## 2024-02-16 ENCOUNTER — Inpatient Hospital Stay: Admitting: Oncology

## 2024-02-22 ENCOUNTER — Inpatient Hospital Stay: Attending: Oncology

## 2024-02-22 DIAGNOSIS — D500 Iron deficiency anemia secondary to blood loss (chronic): Secondary | ICD-10-CM | POA: Diagnosis present

## 2024-02-22 DIAGNOSIS — Z79899 Other long term (current) drug therapy: Secondary | ICD-10-CM | POA: Diagnosis not present

## 2024-02-22 LAB — CBC WITH DIFFERENTIAL (CANCER CENTER ONLY)
Abs Immature Granulocytes: 0.06 10*3/uL (ref 0.00–0.07)
Basophils Absolute: 0 10*3/uL (ref 0.0–0.1)
Basophils Relative: 1 %
Eosinophils Absolute: 0.1 10*3/uL (ref 0.0–0.5)
Eosinophils Relative: 2 %
HCT: 39.5 % (ref 36.0–46.0)
Hemoglobin: 12.5 g/dL (ref 12.0–15.0)
Immature Granulocytes: 1 %
Lymphocytes Relative: 37 %
Lymphs Abs: 2.4 10*3/uL (ref 0.7–4.0)
MCH: 25.1 pg — ABNORMAL LOW (ref 26.0–34.0)
MCHC: 31.6 g/dL (ref 30.0–36.0)
MCV: 79.2 fL — ABNORMAL LOW (ref 80.0–100.0)
Monocytes Absolute: 0.3 10*3/uL (ref 0.1–1.0)
Monocytes Relative: 5 %
Neutro Abs: 3.5 10*3/uL (ref 1.7–7.7)
Neutrophils Relative %: 54 %
Platelet Count: 402 10*3/uL — ABNORMAL HIGH (ref 150–400)
RBC: 4.99 MIL/uL (ref 3.87–5.11)
RDW: 15.3 % (ref 11.5–15.5)
WBC Count: 6.5 10*3/uL (ref 4.0–10.5)
nRBC: 0 % (ref 0.0–0.2)

## 2024-02-22 LAB — IRON AND TIBC
Iron: 34 ug/dL (ref 28–170)
Saturation Ratios: 7 % — ABNORMAL LOW (ref 10.4–31.8)
TIBC: 475 ug/dL — ABNORMAL HIGH (ref 250–450)
UIBC: 441 ug/dL

## 2024-02-22 LAB — RETIC PANEL
Immature Retic Fract: 11.4 % (ref 2.3–15.9)
RBC.: 4.93 MIL/uL (ref 3.87–5.11)
Retic Count, Absolute: 67.5 10*3/uL (ref 19.0–186.0)
Retic Ct Pct: 1.4 % (ref 0.4–3.1)
Reticulocyte Hemoglobin: 28.5 pg (ref >27.9)

## 2024-02-22 LAB — FERRITIN: Ferritin: 5 ng/mL — ABNORMAL LOW (ref 11–307)

## 2024-02-23 ENCOUNTER — Inpatient Hospital Stay

## 2024-02-23 ENCOUNTER — Encounter: Payer: Self-pay | Admitting: Oncology

## 2024-02-23 ENCOUNTER — Inpatient Hospital Stay (HOSPITAL_BASED_OUTPATIENT_CLINIC_OR_DEPARTMENT_OTHER): Admitting: Oncology

## 2024-02-23 VITALS — BP 119/73 | HR 95 | Resp 16

## 2024-02-23 VITALS — BP 119/75 | HR 94 | Temp 98.3°F | Wt 238.7 lb

## 2024-02-23 DIAGNOSIS — D500 Iron deficiency anemia secondary to blood loss (chronic): Secondary | ICD-10-CM | POA: Diagnosis not present

## 2024-02-23 MED ORDER — IRON SUCROSE 20 MG/ML IV SOLN
200.0000 mg | Freq: Once | INTRAVENOUS | Status: AC
  Administered 2024-02-23: 200 mg via INTRAVENOUS
  Filled 2024-02-23: qty 10

## 2024-02-23 NOTE — Patient Instructions (Signed)

## 2024-02-23 NOTE — Progress Notes (Signed)
 Hematology/Oncology Progress note [phone] [phone]         Patient Care Team: Ma Saupe, MD as PCP - General (Family Medicine) Timmy Forbes, MD as Consulting Physician (Hematology and Oncology)   REFERRING PROVIDER: Ma Saupe, MD  CHIEF COMPLAINTS/REASON FOR VISIT:  Anemia  ASSESSMENT & PLAN:   Iron  deficiency anemia due to chronic blood loss Labs are reviewed and discussed with patient. Lab Results  Component Value Date   HGB 12.5 02/22/2024   TIBC 475 (H) 02/22/2024   IRONPCTSAT 7 (L) 02/22/2024   FERRITIN 5 (L) 02/22/2024    Recommend additional  IV venofer  weekly x 4   Orders Placed This Encounter  Procedures   CBC with Differential (Cancer Center Only)    Standing Status:   Future    Expected Date:   06/25/2024    Expiration Date:   02/22/2025   Ferritin    Standing Status:   Future    Expected Date:   06/25/2024    Expiration Date:   02/22/2025   Iron  and TIBC    Standing Status:   Future    Expected Date:   06/25/2024    Expiration Date:   02/22/2025   Follow-up in 6 months  All questions were answered. The patient knows to call the clinic with any problems, questions or concerns.  Timmy Forbes, MD, PhD [facility] Hematology Oncology 02/23/2024     HISTORY OF PRESENTING ILLNESS:  Kara House is a  23 y.o.  female with PMH listed below who was referred to me for anemia Reviewed patient's recent labs that was done.  Patient has a history of iron  deficient anemia.  Most recently on 11/02/2023, CBC showed decreased hemoglobin of 10.3, MCV 74, iron  saturation 4, ferritin 4.  TIBC 447. Patient reports feeling very fatigued, with dizziness.  She denies recent chest pain on exertion,  pre-syncopal episodes, or palpitations She had not noticed any recent bleeding such as epistaxis, hematuria or hematochezia.  She reports that menstrual period usually used to be heavy and currently flow is quite light. She denies over the  counter NSAID ingestion. She is not on antiplatelets agents. Patient has tried oral iron  supplementation unable to tolerate due to GI toxicity-nausea  She is married and has 2 children.  Youngest child is 44 years old.  She has a copper IUD.  INTERVAL HISTORY Kara House is a 23 y.o. female who has above history reviewed by me today presents for follow up visit for iron  deficiency anemia. She tolerated IV Venofer  treatments well.  No side effects.  Fatigue has improved.    MEDICAL HISTORY:  Past Medical History:  Diagnosis Date   Prediabetes     SURGICAL HISTORY: Past Surgical History:  Procedure Laterality Date   PARATHYROIDECTOMY  11/2021   TONSILLECTOMY      SOCIAL HISTORY: Social History   Socioeconomic History   Marital status: Married    Spouse name: Not on file   Number of children: Not on file   Years of education: Not on file   Highest education level: Not on file  Occupational History   Not on file  Tobacco Use   Smoking status: Never   Smokeless tobacco: Never  Vaping Use   Vaping status: Never Used  Substance and Sexual Activity   Alcohol use: Yes    Comment: occasional   Drug use: Never   Sexual activity: Yes  Other Topics Concern   Not on file  Social  History Narrative   Not on file   Social Drivers of Health   Financial Resource Strain: Not on file  Food Insecurity: No Food Insecurity (11/19/2023)   Hunger Vital Sign    Worried About Running Out of Food in the Last Year: Never true    Ran Out of Food in the Last Year: Never true  Transportation Needs: No Transportation Needs (11/19/2023)   PRAPARE - Administrator, Civil Service (Medical): No    Lack of Transportation (Non-Medical): No  Physical Activity: Not on file  Stress: Not on file  Social Connections: Not on file  Intimate Partner Violence: Not At Risk (11/19/2023)   Humiliation, Afraid, Rape, and Kick questionnaire    Fear of Current or Ex-Partner: No    Emotionally  Abused: No    Physically Abused: No    Sexually Abused: No    FAMILY HISTORY: Family History  Problem Relation Age of Onset   Stomach cancer Maternal Grandmother     ALLERGIES:  has no known allergies.  MEDICATIONS:  Current Outpatient Medications  Medication Sig Dispense Refill   fluocinonide  (LIDEX ) 0.05 % external solution APPLY 1 TO 2 DROPS TO AFFECTED AREAS ON SCALP ONCE TO TWICE DAILY FOR ITCHING 60 mL 0   ketoconazole  (NIZORAL ) 2 % shampoo Shampoo into scalp let sit 5-10 minutes then wash out. Use 2-3 days per week. 120 mL 5   metroNIDAZOLE  (METROCREAM ) 0.75 % cream For acne/rosacea apply to the face QD-BID. 45 g 5   mometasone  (ELOCON ) 0.1 % cream Apply to hands QHS and cover with gloves PRN. 45 g 1   Semaglutide -Weight Management 0.5 MG/0.5ML SOAJ Inject 0.5 mg into the skin once a week. 2 mL 1   tretinoin  (RETIN-A ) 0.05 % cream Apply topically at bedtime. qhs to face as tolerated for acne 45 g 2   azelastine  (ASTELIN ) 0.1 % nasal spray Place 2 sprays into both nostrils 2 (two) times daily. Use in each nostril as directed (Patient not taking: Reported on 02/23/2024) 30 mL 1   benzonatate  (TESSALON ) 100 MG capsule Take 1 capsule (100 mg total) by mouth 2 (two) times daily as needed for cough. (Patient not taking: Reported on 02/23/2024) 20 capsule 0   pantoprazole  (PROTONIX ) 40 MG tablet Take 1 tablet (40 mg total) by mouth daily as needed. 30+ minutes prior to dinner on empty stomach. (Patient not taking: Reported on 02/23/2024) 90 tablet 3   No current facility-administered medications for this visit.    Review of Systems  Constitutional:  Positive for fatigue. Negative for appetite change, chills and fever.  HENT:   Negative for hearing loss and voice change.   Eyes:  Negative for eye problems.  Respiratory:  Negative for chest tightness and cough.   Cardiovascular:  Negative for chest pain.  Gastrointestinal:  Negative for abdominal distention, abdominal pain and blood in  stool.  Endocrine: Negative for hot flashes.  Genitourinary:  Negative for difficulty urinating and frequency.   Musculoskeletal:  Negative for arthralgias.  Skin:  Negative for itching and rash.  Neurological:  Negative for dizziness and extremity weakness.  Hematological:  Negative for adenopathy.  Psychiatric/Behavioral:  Negative for confusion.     PHYSICAL EXAMINATION: Vitals:   02/23/24 1303  BP: 119/75  Pulse: 94  Temp: 98.3 F (36.8 C)  SpO2: 100%   Filed Weights   02/23/24 1303  Weight: 238 lb 11.2 oz (108.3 kg)    Physical Exam Constitutional:  General: She is not in acute distress. HENT:     Head: Normocephalic and atraumatic.  Eyes:     General: No scleral icterus. Cardiovascular:     Rate and Rhythm: Normal rate and regular rhythm.     Heart sounds: Normal heart sounds.  Pulmonary:     Effort: Pulmonary effort is normal. No respiratory distress.     Breath sounds: No wheezing.  Abdominal:     General: Bowel sounds are normal. There is no distension.     Palpations: Abdomen is soft.  Musculoskeletal:        General: No deformity. Normal range of motion.     Cervical back: Normal range of motion and neck supple.  Skin:    General: Skin is warm and dry.     Findings: No erythema or rash.  Neurological:     Mental Status: She is alert and oriented to person, place, and time. Mental status is at baseline.     Cranial Nerves: No cranial nerve deficit.     Coordination: Coordination normal.  Psychiatric:        Mood and Affect: Mood normal.      LABORATORY DATA:  I have reviewed the data as listed    Latest Ref Rng & Units 02/22/2024    3:27 PM 12/30/2023   12:43 PM 11/02/2023    8:41 AM  CBC  WBC 4.0 - 10.5 K/uL 6.5  4.6  5.9   Hemoglobin 12.0 - 15.0 g/dL 19.1  47.8  29.5   Hematocrit 36.0 - 46.0 % 39.5  41.5  35.4   Platelets 150 - 400 K/uL 402  347  439       Latest Ref Rng & Units 12/30/2023   12:43 PM 11/02/2023    8:41 AM 04/24/2023     9:13 PM  CMP  Glucose 70 - 99 mg/dL 92  80  621   BUN 6 - 20 mg/dL 7  9  12    Creatinine 0.44 - 1.00 mg/dL 3.08  6.57  8.46   Sodium 135 - 145 mmol/L 133  140  136   Potassium 3.5 - 5.1 mmol/L 3.6  4.7  3.5   Chloride 98 - 111 mmol/L 104  102  104   CO2 22 - 32 mmol/L 22  21  22    Calcium 8.9 - 10.3 mg/dL 8.5  9.0  9.1   Total Protein 6.0 - 8.5 g/dL  7.0  8.8   Total Bilirubin 0.0 - 1.2 mg/dL  <9.6  0.5   Alkaline Phos 44 - 121 IU/L  126  114   AST 0 - 40 IU/L  17  20   ALT 0 - 32 IU/L  18  21    Lab Results  Component Value Date   IRON  34 02/22/2024   TIBC 475 (H) 02/22/2024   IRONPCTSAT 7 (L) 02/22/2024   FERRITIN 5 (L) 02/22/2024     RADIOGRAPHIC STUDIES: I have personally reviewed the radiological images as listed and agreed with the findings in the report. No results found.

## 2024-02-23 NOTE — Assessment & Plan Note (Signed)
 Labs are reviewed and discussed with patient. Lab Results  Component Value Date   HGB 12.5 02/22/2024   TIBC 475 (H) 02/22/2024   IRONPCTSAT 7 (L) 02/22/2024   FERRITIN 5 (L) 02/22/2024    Recommend additional  IV venofer  weekly x 4

## 2024-02-24 LAB — CELIAC PANEL 10
Antigliadin Abs, IgA: 7 U (ref 0–19)
Endomysial Ab, IgA: NEGATIVE
Gliadin IgG: 2 U (ref 0–19)
IgA: 230 mg/dL (ref 87–352)
Tissue Transglut Ab: 5 U/mL (ref 0–5)
Tissue Transglutaminase Ab, IgA: <2 U/mL (ref 0–3)

## 2024-03-01 ENCOUNTER — Inpatient Hospital Stay

## 2024-03-02 ENCOUNTER — Inpatient Hospital Stay

## 2024-03-02 VITALS — BP 108/74 | HR 99 | Temp 96.0°F | Resp 18

## 2024-03-02 DIAGNOSIS — D500 Iron deficiency anemia secondary to blood loss (chronic): Secondary | ICD-10-CM | POA: Diagnosis not present

## 2024-03-02 MED ORDER — IRON SUCROSE 20 MG/ML IV SOLN
200.0000 mg | Freq: Once | INTRAVENOUS | Status: AC
  Administered 2024-03-02: 200 mg via INTRAVENOUS

## 2024-03-02 MED ORDER — SODIUM CHLORIDE 0.9% FLUSH
10.0000 mL | Freq: Once | INTRAVENOUS | Status: AC | PRN
  Administered 2024-03-02: 10 mL
  Filled 2024-03-02: qty 10

## 2024-03-08 ENCOUNTER — Inpatient Hospital Stay

## 2024-03-09 ENCOUNTER — Inpatient Hospital Stay

## 2024-03-09 VITALS — BP 114/84 | HR 98 | Temp 98.0°F | Resp 18

## 2024-03-09 DIAGNOSIS — D500 Iron deficiency anemia secondary to blood loss (chronic): Secondary | ICD-10-CM | POA: Diagnosis not present

## 2024-03-09 MED ORDER — SODIUM CHLORIDE 0.9% FLUSH
10.0000 mL | Freq: Once | INTRAVENOUS | Status: AC | PRN
  Administered 2024-03-09: 10 mL
  Filled 2024-03-09: qty 10

## 2024-03-09 MED ORDER — IRON SUCROSE 20 MG/ML IV SOLN
200.0000 mg | Freq: Once | INTRAVENOUS | Status: AC
  Administered 2024-03-09: 200 mg via INTRAVENOUS
  Filled 2024-03-09: qty 10

## 2024-03-15 ENCOUNTER — Inpatient Hospital Stay: Attending: Oncology

## 2024-03-15 VITALS — BP 112/78 | HR 98 | Temp 97.7°F | Resp 18

## 2024-03-15 DIAGNOSIS — D500 Iron deficiency anemia secondary to blood loss (chronic): Secondary | ICD-10-CM

## 2024-03-15 DIAGNOSIS — D509 Iron deficiency anemia, unspecified: Secondary | ICD-10-CM | POA: Insufficient documentation

## 2024-03-15 MED ORDER — IRON SUCROSE 20 MG/ML IV SOLN
200.0000 mg | Freq: Once | INTRAVENOUS | Status: AC
  Administered 2024-03-15: 200 mg via INTRAVENOUS
  Filled 2024-03-15: qty 10

## 2024-03-15 MED ORDER — SODIUM CHLORIDE 0.9% FLUSH
10.0000 mL | Freq: Once | INTRAVENOUS | Status: AC | PRN
  Administered 2024-03-15: 10 mL
  Filled 2024-03-15: qty 10

## 2024-04-12 ENCOUNTER — Encounter: Payer: Self-pay | Admitting: Family Medicine

## 2024-04-12 ENCOUNTER — Ambulatory Visit: Admitting: Family Medicine

## 2024-04-24 ENCOUNTER — Ambulatory Visit: Admitting: Family Medicine

## 2024-05-09 ENCOUNTER — Ambulatory Visit: Admitting: Family Medicine

## 2024-05-09 ENCOUNTER — Ambulatory Visit: Payer: Medicaid Other | Admitting: Dermatology

## 2024-05-16 ENCOUNTER — Ambulatory Visit (INDEPENDENT_AMBULATORY_CARE_PROVIDER_SITE_OTHER): Admitting: Family Medicine

## 2024-05-16 ENCOUNTER — Encounter: Payer: Self-pay | Admitting: Family Medicine

## 2024-05-16 VITALS — BP 120/76 | HR 90 | Ht 63.0 in | Wt 245.2 lb

## 2024-05-16 DIAGNOSIS — Z6841 Body Mass Index (BMI) 40.0 and over, adult: Secondary | ICD-10-CM | POA: Diagnosis not present

## 2024-05-16 DIAGNOSIS — Z7689 Persons encountering health services in other specified circumstances: Secondary | ICD-10-CM

## 2024-05-16 DIAGNOSIS — K2100 Gastro-esophageal reflux disease with esophagitis, without bleeding: Secondary | ICD-10-CM | POA: Diagnosis not present

## 2024-05-19 MED ORDER — SEMAGLUTIDE-WEIGHT MANAGEMENT 0.25 MG/0.5ML ~~LOC~~ SOAJ
0.2500 mg | SUBCUTANEOUS | 1 refills | Status: DC
Start: 2024-05-19 — End: 2024-06-05

## 2024-05-19 MED ORDER — PANTOPRAZOLE SODIUM 40 MG PO TBEC: 40.0000 mg | DELAYED_RELEASE_TABLET | Freq: Every day | ORAL | 3 refills | Status: DC | PRN

## 2024-05-19 NOTE — Assessment & Plan Note (Signed)
 Weight management and wegovy  use - Used Wegovy  for weight management, discontinued after two months - Experienced loss of appetite and reduced food intake while on Wegovy  - No nausea while on Wegovy  - Bowel movements were regular (every morning and sometimes at night) until recent pain episode - Since stopping Wegovy  in April, has experienced weight gain and feels heavier  Obesity Weight gain post-Wegovy  cessation. Emphasized weight management medication coupled with dietary and activity changes. - Send prescription for Wegovy . - Refer to nutritionist for dietary guidance. - Encourage dietary changes, such as switching from vegetable oil to olive oil. - Advise on increasing physical activity, such as walking with children once a week.

## 2024-05-19 NOTE — Assessment & Plan Note (Signed)
>>  ASSESSMENT AND PLAN FOR BMI 40.0-44.9, ADULT (HCC) WRITTEN ON 05/19/2024  4:03 PM BY Melvyn Hommes J, MD  Weight management and wegovy  use - Used Wegovy  for weight management, discontinued after two months - Experienced loss of appetite and reduced food intake while on Wegovy  - No nausea while on Wegovy  - Bowel movements were regular (every morning and sometimes at night) until recent pain episode - Since stopping Wegovy  in April, has experienced weight gain and feels heavier  Obesity Weight gain post-Wegovy  cessation. Emphasized weight management medication coupled with dietary and activity changes. - Send prescription for Wegovy . - Refer to nutritionist for dietary guidance. - Encourage dietary changes, such as switching from vegetable oil to olive oil. - Advise on increasing physical activity, such as walking with children once a week.

## 2024-05-19 NOTE — Assessment & Plan Note (Signed)
 Abdominal pain - Severe abdominal pain occurring between 2 to 3 AM, lasting until 5 AM - Pain is intense and significantly disrupts sleep - Not associated with consumption of spicy or greasy foods - Pantoprazole  taken as needed, especially before consuming foods to avoid, but pain occurred without dietary triggers - Minimal bowel movements during the episode  Physical Exam ABDOMEN: Abdomen is soft, non-tender, and non-distended. Hypoactive bowel sounds are present. No hepatosplenomegaly.  Gastroesophageal reflux disease Severe epigastric pain likely due to GERD, exacerbated by Wegovy . Discussed pantoprazole  for symptom management. - Prescribe pantoprazole  for daily use.  Take daily and empty stomach.

## 2024-05-22 NOTE — Progress Notes (Signed)
     Primary Care / Sports Medicine Office Visit  Patient Information:  Patient ID: Kara House, female DOB: 2001-05-28 Age: 23 y.o. MRN: [id number]   Kara House is a pleasant 23 y.o. female presenting with the following:  Chief Complaint  Patient presents with   Weight Management Screening    Patient would like to start Wegovy  again.     Vitals:   05/16/24 1607  BP: 120/76  Pulse: 90  SpO2: 98%   Vitals:   05/16/24 1607  Weight: 245 lb 3.2 oz (111.2 kg)  Height: 5' 3 (1.6 m)   Body mass index is 43.44 kg/m.  No results found.   Independent interpretation of notes and tests performed by another provider:   None  Procedures performed:   None  Pertinent History, Exam, Impression, and Recommendations:   Problem List Items Addressed This Visit     BMI 40.0-44.9, adult (HCC) - Primary   Weight management and wegovy  use - Used Wegovy  for weight management, discontinued after two months - Experienced loss of appetite and reduced food intake while on Wegovy  - No nausea while on Wegovy  - Bowel movements were regular (every morning and sometimes at night) until recent pain episode - Since stopping Wegovy  in April, has experienced weight gain and feels heavier  Obesity Weight gain post-Wegovy  cessation. Emphasized weight management medication coupled with dietary and activity changes. - Send prescription for Wegovy . - Refer to nutritionist for dietary guidance. - Encourage dietary changes, such as switching from vegetable oil to olive oil. - Advise on increasing physical activity, such as walking with children once a week.      Relevant Medications   semaglutide -weight management (WEGOVY ) 0.25 MG/0.5ML SOAJ SQ injection   Encounter for weight management   Gastroesophageal reflux disease with esophagitis without hemorrhage   Abdominal pain - Severe abdominal pain occurring between 2 to 3 AM, lasting until 5 AM - Pain is intense and significantly disrupts  sleep - Not associated with consumption of spicy or greasy foods - Pantoprazole  taken as needed, especially before consuming foods to avoid, but pain occurred without dietary triggers - Minimal bowel movements during the episode  Physical Exam ABDOMEN: Abdomen is soft, non-tender, and non-distended. Hypoactive bowel sounds are present. No hepatosplenomegaly.  Gastroesophageal reflux disease Severe epigastric pain likely due to GERD, exacerbated by Wegovy . Discussed pantoprazole  for symptom management. - Prescribe pantoprazole  for daily use.  Take daily and empty stomach.      Relevant Medications   pantoprazole  (PROTONIX ) 40 MG tablet     Orders & Medications Medications:  Meds ordered this encounter  Medications   semaglutide -weight management (WEGOVY ) 0.25 MG/0.5ML SOAJ SQ injection    Sig: Inject 0.25 mg into the skin once a week.    Dispense:  2 mL    Refill:  1   pantoprazole  (PROTONIX ) 40 MG tablet    Sig: Take 1 tablet (40 mg total) by mouth daily as needed. 30+ minutes prior to dinner on empty stomach.    Dispense:  90 tablet    Refill:  3   No orders of the defined types were placed in this encounter.    No follow-ups on file.     Selinda JINNY Ku, MD, [facility]   Primary Care Sports Medicine Primary Care and Sports Medicine at [facility]

## 2024-05-22 NOTE — Patient Instructions (Addendum)
 Patient Plan for Post-Visit Guidance  Obesity and Weight Management - Start Wegovy  as prescribed for weight management. - Referral placed to a nutritionist for dietary guidance. - Make dietary changes, such as switching from vegetable oil to olive oil. - Increase physical activity, for example, walking with your children once a week.  Gastroesophageal Reflux Disease (GERD) - Take pantoprazole  40 mg daily on an empty stomach for acid reflux and abdominal pain.  Red Flags - If you experience severe or persistent abdominal pain, vomiting blood, black stools, difficulty swallowing, chest pain, or significant unintentional weight loss, seek medical attention promptly.

## 2024-05-24 ENCOUNTER — Telehealth: Payer: Self-pay | Admitting: Pharmacy Technician

## 2024-05-24 ENCOUNTER — Encounter: Payer: Self-pay | Admitting: Oncology

## 2024-05-24 ENCOUNTER — Other Ambulatory Visit (HOSPITAL_COMMUNITY): Payer: Self-pay

## 2024-05-24 NOTE — Telephone Encounter (Signed)
 Pharmacy Patient Advocate Encounter   Received notification from [employer] that prior authorization for Wegovy  0.25 mg is required/requested.   Insurance verification completed.   The patient is insured through [place] [employer] .   Per test claim: PA required; PA submitted to above mentioned insurance via Latent Key/confirmation #/EOC [id number] Status is pending

## 2024-05-24 NOTE — Telephone Encounter (Signed)
 Pharmacy Patient Advocate Encounter  Received notification from [employer] that Prior Authorization for Wegovy  0.25MG /0.5ML auto-injectors  has been APPROVED from 05/24/24 to 11/20/24. Ran test claim, Copay is $4.00. This test claim was processed through [facility]- copay amounts may vary at other pharmacies due to pharmacy/plan contracts, or as the patient moves through the different stages of their insurance plan.   PA #/Case ID/Reference #: [id number]

## 2024-05-29 ENCOUNTER — Ambulatory Visit: Admitting: Dermatology

## 2024-06-04 ENCOUNTER — Encounter: Payer: Self-pay | Admitting: Family Medicine

## 2024-06-05 ENCOUNTER — Other Ambulatory Visit: Payer: Self-pay

## 2024-06-05 DIAGNOSIS — Z6841 Body Mass Index (BMI) 40.0 and over, adult: Secondary | ICD-10-CM

## 2024-06-05 DIAGNOSIS — K2100 Gastro-esophageal reflux disease with esophagitis, without bleeding: Secondary | ICD-10-CM

## 2024-06-05 MED ORDER — PANTOPRAZOLE SODIUM 40 MG PO TBEC: 40.0000 mg | DELAYED_RELEASE_TABLET | Freq: Every day | ORAL | 3 refills | Status: DC | PRN

## 2024-06-05 MED ORDER — SEMAGLUTIDE-WEIGHT MANAGEMENT 0.25 MG/0.5ML ~~LOC~~ SOAJ: 0.2500 mg | SUBCUTANEOUS | 1 refills | Status: DC

## 2024-06-14 ENCOUNTER — Ambulatory Visit (INDEPENDENT_AMBULATORY_CARE_PROVIDER_SITE_OTHER): Admitting: Dermatology

## 2024-06-14 DIAGNOSIS — L700 Acne vulgaris: Secondary | ICD-10-CM | POA: Diagnosis not present

## 2024-06-14 DIAGNOSIS — L249 Irritant contact dermatitis, unspecified cause: Secondary | ICD-10-CM | POA: Diagnosis not present

## 2024-06-14 DIAGNOSIS — L219 Seborrheic dermatitis, unspecified: Secondary | ICD-10-CM | POA: Diagnosis not present

## 2024-06-14 DIAGNOSIS — L719 Rosacea, unspecified: Secondary | ICD-10-CM

## 2024-06-14 DIAGNOSIS — B009 Herpesviral infection, unspecified: Secondary | ICD-10-CM | POA: Diagnosis not present

## 2024-06-14 DIAGNOSIS — L732 Hidradenitis suppurativa: Secondary | ICD-10-CM

## 2024-06-14 MED ORDER — VALACYCLOVIR HCL 1 G PO TABS: ORAL_TABLET | ORAL | 0 refills | Status: DC

## 2024-06-14 MED ORDER — DOXYCYCLINE HYCLATE 100 MG PO TABS: 100.0000 mg | ORAL_TABLET | Freq: Two times a day (BID) | ORAL | 4 refills | Status: DC

## 2024-06-14 MED ORDER — KETOCONAZOLE 2 % EX SHAM: MEDICATED_SHAMPOO | CUTANEOUS | 5 refills | Status: DC

## 2024-06-14 NOTE — Patient Instructions (Addendum)
 Doxycycline  should be taken with food to prevent nausea. Do not lay down for 30 minutes after taking. Be cautious with sun exposure and use good sun protection while on this medication. Pregnant women should not take this medication.    Due to recent changes in healthcare laws, you may see results of your pathology and/or laboratory studies on MyChart before the doctors have had a chance to review them. We understand that in some cases there may be results that are confusing or concerning to you. Please understand that not all results are received at the same time and often the doctors may need to interpret multiple results in order to provide you with the best plan of care or course of treatment. Therefore, we ask that you please give us  2 business days to thoroughly review all your results before contacting the office for clarification. Should we see a critical lab result, you will be contacted sooner.   If You Need Anything After Your Visit  If you have any questions or concerns for your doctor, please call our main line at [phone] and press option 4 to reach your doctor's medical assistant. If no one answers, please leave a voicemail as directed and we will return your call as soon as possible. Messages left after 4 pm will be answered the following business day.   You may also send us  a message via MyChart. We typically respond to MyChart messages within 1-2 business days.  For prescription refills, please ask your pharmacy to contact our office. Our fax number is [phone].  If you have an urgent issue when the clinic is closed that cannot wait until the next business day, you can page your doctor at the number below.    Please note that while we do our best to be available for urgent issues outside of office hours, we are not available 24/7.   If you have an urgent issue and are unable to reach us , you may choose to seek medical care at your doctor's office, retail clinic, urgent  care center, or emergency room.  If you have a medical emergency, please immediately call 911 or go to the emergency department.  Pager Numbers  - Dr. Hester: [phone]  - Dr. Jackquline: [phone]  - Dr. Claudene: [phone]   - Dr. Raymund: [phone]  In the event of inclement weather, please call our main line at [phone] for an update on the status of any delays or closures.  Dermatology Medication Tips: Please keep the boxes that topical medications come in in order to help keep track of the instructions about where and how to use these. Pharmacies typically print the medication instructions only on the boxes and not directly on the medication tubes.   If your medication is too expensive, please contact our office at [phone] option 4 or send us  a message through MyChart.   We are unable to tell what your co-pay for medications will be in advance as this is different depending on your insurance coverage. However, we may be able to find a substitute medication at lower cost or fill out paperwork to get insurance to cover a needed medication.   If a prior authorization is required to get your medication covered by your insurance company, please allow us  1-2 business days to complete this process.  Drug prices often vary depending on where the prescription is filled and some pharmacies may offer cheaper prices.  The website www.goodrx.com contains coupons for medications through different pharmacies. The prices  here do not account for what the cost may be with help from insurance (it may be cheaper with your insurance), but the website can give you the price if you did not use any insurance.  - You can print the associated coupon and take it with your prescription to the pharmacy.  - You may also stop by our office during regular business hours and pick up a GoodRx coupon card.  - If you need your prescription sent electronically to a different pharmacy, notify our office  through [facility] or by phone at [phone] option 4.     Si Usted Necesita Algo Despus de Su Visita  Tambin puede enviarnos un mensaje a travs de Clinical cytogeneticist. Por lo general respondemos a los mensajes de MyChart en el transcurso de 1 a 2 das hbiles.  Para renovar recetas, por favor pida a su farmacia que se ponga en contacto con nuestra oficina. Randi lakes de fax es [place] [phone].  Si tiene un asunto urgente cuando la clnica est cerrada y que no puede esperar hasta el siguiente da hbil, puede llamar/localizar a su doctor(a) al nmero que aparece a continuacin.   Por favor, tenga en cuenta que aunque hacemos todo lo posible para estar disponibles para asuntos urgentes fuera del horario de [place], no estamos disponibles las 24 horas del da, los 7 [address]  [address] de la [place].   Si tiene un problema urgente y no puede comunicarse con nosotros, puede optar por buscar atencin mdica  en el consultorio de su doctor(a), en una clnica privada, en un centro de atencin urgente o en una sala de emergencias.  Si tiene Engineer, drilling, por favor llame inmediatamente al 911 o vaya a la sala de emergencias.  Nmeros de bper  - Dr. Hester: [phone]  - Dra. Jackquline: [phone]  - Dr. Claudene: [phone]  - Dra. Kitts: [phone]  En caso de inclemencias del [place], por favor llame a nuestra lnea principal al [phone] para una actualizacin sobre el estado de cualquier retraso o cierre.  Consejos para la medicacin en dermatologa: Por favor, guarde las cajas en las que vienen los medicamentos de uso tpico para ayudarle a seguir las instrucciones sobre dnde y cmo usarlos. Las farmacias generalmente imprimen las instrucciones del medicamento slo en las cajas y no directamente en los tubos del [place].   Si su medicamento es muy caro, por favor, pngase en contacto con landry rieger llamando al [phone] y presione la opcin 4 o envenos un mensaje  a travs de Clinical cytogeneticist.   No podemos decirle cul ser su copago por los medicamentos por adelantado ya que esto es diferente dependiendo de la cobertura de su seguro. Sin embargo, es posible que podamos encontrar un medicamento sustituto a Audiological scientist un formulario para que el seguro cubra el medicamento que se considera necesario.   Si se requiere una autorizacin previa para que su compaa de seguros malta su medicamento, por favor permtanos de 1 a 2 das hbiles para completar este proceso.  Los precios de los medicamentos varan con frecuencia dependiendo del Environmental consultant de dnde se surte la receta y alguna farmacias pueden ofrecer precios ms baratos.  El sitio web www.goodrx.com tiene cupones para medicamentos de Health and safety inspector. Los precios aqu no tienen en cuenta lo que podra costar con la ayuda del seguro (puede ser ms barato con su seguro), pero el sitio web puede darle el precio si no utiliz Tourist information centre manager.  - Puede imprimir el cupn correspondiente y llevarlo  con su receta a la farmacia.  - Tambin puede pasar por nuestra oficina durante el horario de atencin regular y Education officer, museum una tarjeta de cupones de GoodRx.  - Si necesita que su receta se enve electrnicamente a una farmacia diferente, informe a nuestra oficina a travs de MyChart de [place] o por telfono llamando al [phone] y presione la opcin 4.

## 2024-06-14 NOTE — Progress Notes (Signed)
 Follow-Up Visit   Subjective  Kara House is a 23 y.o. female who presents for the following: f/up HS, Seb derm, Hand derm doing well, needs some rfs.  Has had recent flare of rosacea, also has cold sore on lip, uses acyclovir ointment.   The following portions of the chart were reviewed this encounter and updated as appropriate: medications, allergies, medical history  Review of Systems:  No other skin or systemic complaints except as noted in HPI or Assessment and Plan.  Objective  Well appearing patient in no apparent distress; mood and affect are within normal limits.   A focused examination was performed of the following areas: Face, scalp  Relevant exam findings are noted in the Assessment and Plan.    Assessment & Plan   HIDRADENITIS SUPPURATIVA Exam: Pt states axilla clear today   Chronic condition with duration or expected duration over one year. Currently well-controlled.   Hidradenitis Suppurativa is a chronic; persistent; non-curable, but treatable condition due to abnormal inflamed sweat glands in the body folds (axilla, inframammary, groin, medial thighs), causing recurrent painful draining cysts and scarring. It can be associated with severe scarring acne and cysts; also abscesses and scarring of scalp. The goal is control and prevention of flares, as it is not curable. Scars are permanent and can be thickened. Treatment may include daily use of topical medication and oral antibiotics.  Oral isotretinoin may also be helpful.  For some cases, Humira or Cosentyx (biologic injections) may be prescribed to decrease the inflammatory process and prevent flares.  When indicated, inflamed cysts may also be treated surgically.   Treatment Plan: Continue Clindamycin  lotion every day to axilla Continue Doxycycline  100 mg po every day/BID PRN flares.  Doxycycline  should be taken with food to prevent nausea. Do not lay down for 30 minutes after taking. Be cautious with sun  exposure and use good sun protection while on this medication. Pregnant women should not take this medication.    ACNE VULGARIS with Rosacea Exam: few pink papules on the malar cheeks, inflamed comedones on the cheeks and chin.    Chronic and persistent condition with duration or expected duration over one year. Condition is symptomatic/ bothersome to patient. Not currently at goal.    Treatment Plan: Discussed starting Doxycycline  100 mg PO every day as needed for acne/rosacea flares Continue Metronidazole  0.75% cream QD-BID.  Continue Adapalene  0.1% gel apply a thin coat at bedtime every other night to every third night as tolerated  Continue gentle moisturizer w/sunscreen in the AM and without sunscreen in the PM; Cereve samples given in office  Continue gentle cleanser AM and PM    SEBORRHEIC DERMATITIS Exam: scalp clear today   Chronic condition with duration or expected duration over one year. Currently well-controlled.   Seborrheic Dermatitis is a chronic persistent rash characterized by pinkness and scaling most commonly of the mid face but also can occur on the scalp (dandruff), ears; mid chest, mid back and groin.  It tends to be exacerbated by stress and cooler weather.  People who have neurologic disease may experience new onset or exacerbation of existing seborrheic dermatitis.  The condition is not curable but treatable and can be controlled.   Treatment Plan: Continue Ketoconazole  2% shampoo 1-2 days per week. Let sit several minutes prior to rinsing Continue Lidex  solution PRN itch.     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 Currently well-controlled.  Hand Dermatitis is a chronic type of eczema that can come and go on the hands and fingers.  While there is no cure, the rash and symptoms can be managed with topical prescription medications, and for more severe cases, with systemic  medications.  Recommend mild soap and routine use of moisturizing cream after handwashing.  Minimize soap/water exposure when possible.      Treatment Plan: Continue Mometasone  0.1% cream QD-BID PRN.  Topical steroids (such as triamcinolone, fluocinolone, fluocinonide , mometasone , clobetasol, halobetasol, betamethasone, hydrocortisone) can cause thinning and lightening of the skin if they are used for too long in the same area. Your physician has selected the right strength medicine for your problem and area affected on the body. Please use your medication only as directed by your physician to prevent side effects.    Recommend mild soap with hand washing. Recommend moisturizing throughout the day.   HERPESVIRAL INFECTION (COLD SORES) Exam: Crusted papule at the R lower lip  Chronic and persistent condition with duration or expected duration over one year. Condition is bothersome/symptomatic for patient. Currently flared.  Herpes Simplex Virus = Cold Sores = Fever Blisters is a chronic recurring blistering; scabbing sore-producing viral infection that is recurrent usually in the same area triggered by stress, sun/UV exposure and trauma.  It is infectious and can be spread from person to person by direct contact.  It is not curable, but is treatable with topical and oral medication.  Treatment Plan: Take Valacyclovir  2 grams every 12 hours for 2 doses with a glass of water at the first sign of symptoms.    Return in about 6 months (around 12/12/2024) for HS, Seb derm, Acne, .  I, Emerick Ege, CMA am acting as scribe for Rexene Rattler, MD.    Documentation: I have reviewed the above documentation for accuracy and completeness, and I agree with the above.  Rexene Rattler, MD

## 2024-06-16 ENCOUNTER — Ambulatory Visit: Admitting: Family Medicine

## 2024-06-21 ENCOUNTER — Other Ambulatory Visit: Payer: Self-pay

## 2024-06-21 DIAGNOSIS — K2100 Gastro-esophageal reflux disease with esophagitis, without bleeding: Secondary | ICD-10-CM

## 2024-06-21 DIAGNOSIS — Z6841 Body Mass Index (BMI) 40.0 and over, adult: Secondary | ICD-10-CM

## 2024-06-21 MED ORDER — PANTOPRAZOLE SODIUM 40 MG PO TBEC: 40.0000 mg | DELAYED_RELEASE_TABLET | Freq: Every day | ORAL | 3 refills | Status: AC | PRN

## 2024-06-21 MED ORDER — SEMAGLUTIDE-WEIGHT MANAGEMENT 0.25 MG/0.5ML ~~LOC~~ SOAJ: 0.2500 mg | SUBCUTANEOUS | 1 refills | Status: DC

## 2024-06-23 ENCOUNTER — Ambulatory Visit: Admitting: Family Medicine

## 2024-06-26 ENCOUNTER — Telehealth: Payer: Self-pay | Admitting: Oncology

## 2024-06-26 ENCOUNTER — Inpatient Hospital Stay: Attending: Oncology

## 2024-06-26 NOTE — Telephone Encounter (Signed)
 Pt no showed labs today. Scheduled for md/iron  appt 9/17.  I called and left vm for pt to call back and r/s her lab appt or we can also r/s all appts.  Scheduling phone number given

## 2024-06-28 ENCOUNTER — Inpatient Hospital Stay

## 2024-06-28 ENCOUNTER — Inpatient Hospital Stay: Admitting: Oncology

## 2024-06-29 ENCOUNTER — Encounter: Payer: Self-pay | Admitting: Family Medicine

## 2024-06-29 ENCOUNTER — Ambulatory Visit (INDEPENDENT_AMBULATORY_CARE_PROVIDER_SITE_OTHER): Admitting: Family Medicine

## 2024-06-29 VITALS — BP 112/70 | HR 94 | Ht 63.0 in | Wt 239.8 lb

## 2024-06-29 DIAGNOSIS — E66813 Obesity, class 3: Secondary | ICD-10-CM

## 2024-06-29 DIAGNOSIS — J3089 Other allergic rhinitis: Secondary | ICD-10-CM | POA: Diagnosis not present

## 2024-06-29 DIAGNOSIS — Z6841 Body Mass Index (BMI) 40.0 and over, adult: Secondary | ICD-10-CM | POA: Diagnosis not present

## 2024-06-29 DIAGNOSIS — M5417 Radiculopathy, lumbosacral region: Secondary | ICD-10-CM | POA: Diagnosis not present

## 2024-06-29 MED ORDER — GABAPENTIN 100 MG PO CAPS: 100.0000 mg | ORAL_CAPSULE | Freq: Every day | ORAL | 0 refills | Status: DC | PRN

## 2024-06-29 NOTE — Assessment & Plan Note (Signed)
 Obesity Obesity management with Wegovy  to be started. Discussed weight loss medication, emphasizing adherence to body cues to avoid side effects. Explained potential side effects including slowed bowel movements, bloating, pain, and reflux. - Start weight loss medication as discussed. - Educate on following body cues to avoid overeating. - Advise on potential side effects and management with fiber supplements or MiraLAX.  See separate MyChart message regarding this. - Return in 1 month for weight check and possible medication adjustment.

## 2024-06-29 NOTE — Progress Notes (Signed)
 Primary Care / Sports Medicine Office Visit  Patient Information:  Patient ID: Kara House, female DOB: [birth year] Age: 23 y.o. MRN: [id number]   Kara House is a pleasant 23 y.o. female presenting with the following:  Chief Complaint  Patient presents with   Weight Management Screening    Patient presents today for a weight check. She has not started the wegovy  yet as she was having issues with the insurance but medication has been approved now and she will start this week.     Vitals:   06/29/24 1020  BP: 112/70  Pulse: 94  SpO2: 98%   Vitals:   06/29/24 1020  Weight: 239 lb 12.8 oz (108.8 kg)  Height: 5' 3 (1.6 m)   Body mass index is 42.48 kg/m.  No results found.   Discussed the use of AI scribe software for clinical note transcription with the patient, who gave verbal consent to proceed.   Independent interpretation of notes and tests performed by another provider:   None  Procedures performed:   None  Pertinent History, Exam, Impression, and Recommendations:   Problem List Items Addressed This Visit     Class 3 severe obesity with serious comorbidity and body mass index (BMI) of 40.0 to 44.9 in adult   Obesity Obesity management with Wegovy  to be started. Discussed weight loss medication, emphasizing adherence to body cues to avoid side effects. Explained potential side effects including slowed bowel movements, bloating, pain, and reflux. - Start weight loss medication as discussed. - Educate on following body cues to avoid overeating. - Advise on potential side effects and management with fiber supplements or MiraLAX.  See separate MyChart message regarding this. - Return in 1 month for weight check and possible medication adjustment.      Left lumbosacral radiculopathy   Low back pain with radiculopathy - Low back pain primarily on the left side, present since her second pregnancy - Pain radiates down the left leg - Exacerbated by  prolonged sitting, such as during car rides - Walking provides slight relief - Pain is tolerable - No use of medication for pain  LUMBOSACRAL SPINE / PELVIS INSPECTION: Normal lumbar alignment. No scoliosis, step-offs, swelling, erythema, ecchymosis, atrophy, deformity, or skin changes. Gait appears steady without antalgia. PALPATION: Focal tenderness at the left sacroiliac joint and over the left piriformis muscle. Right sacroiliac joint nontender. No tenderness over lumbar spinous processes, paraspinal musculature, greater trochanter, or gluteus medius distribution. No crepitus, effusion, warmth, nodules, or bony abnormalities. RANGE OF MOTION: Lumbar flexion, extension, lateral bending, and rotation full and symmetric. Discomfort elicited with end-range flexion. STRENGTH: 5/5 strength in bilateral hip flexion, knee extension, ankle dorsiflexion, plantarflexion, and great toe extension. No weakness noted. NEUROVASCULAR: Sensation intact to light touch throughout bilateral lower extremities.  SPECIAL TESTS: Straight leg raise: Positive on the left, negative on the right., Piriformis test: Positive on the left, benign on the right. FABER test: Localizes laterally on the left, negative on the right. FADIR test: Negative bilaterally. Iliopsoas testing: Minor anterolateral thigh discomfort on the left. Kemp's test: Negative bilaterally.  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 - Prescribed gabapentin  100 mg as needed up to three times a day for nerve pain, cautioning about drowsiness. - Provided exercises via [facility] separate MyChart message to stretch and stabilize the affected area. - Schedule follow-up in one month to assess progress.      Relevant  Medications   gabapentin  (NEURONTIN ) 100 MG capsule   Seasonal allergic rhinitis   Upper respiratory  symptoms - Feeling symptoms for the past three weeks - Initial cough that has resolved - Persistent runny nose and sneezing - No medication taken for allergies  Allergic rhinitis Symptoms include runny nose, sneezing, and cough persisting for three weeks. - Recommend starting Claritin, Zyrtec , or Allegra. - Advise using Flonase  nasal spray. - Consider restarting Astelin  Rx nasal spray.      Other Visit Diagnoses       BMI 40.0-44.9, adult (HCC)    -  Primary        Orders & Medications Medications:  Meds ordered this encounter  Medications   gabapentin  (NEURONTIN ) 100 MG capsule    Sig: Take 1-3 capsules (100-300 mg total) by mouth daily as needed.    Dispense:  60 capsule    Refill:  0   No orders of the defined types were placed in this encounter.    Return in about 4 weeks (around 07/27/2024) for Weight MGMT f/u.     Kara JINNY Ku, MD, [facility]   Primary Care Sports Medicine Primary Care and Sports Medicine at [facility]

## 2024-06-29 NOTE — Assessment & Plan Note (Signed)
 Low back pain with radiculopathy - Low back pain primarily on the left side, present since her second pregnancy - Pain radiates down the left leg - Exacerbated by prolonged sitting, such as during car rides - Walking provides slight relief - Pain is tolerable - No use of medication for pain  LUMBOSACRAL SPINE / PELVIS INSPECTION: Normal lumbar alignment. No scoliosis, step-offs, swelling, erythema, ecchymosis, atrophy, deformity, or skin changes. Gait appears steady without antalgia. PALPATION: Focal tenderness at the left sacroiliac joint and over the left piriformis muscle. Right sacroiliac joint nontender. No tenderness over lumbar spinous processes, paraspinal musculature, greater trochanter, or gluteus medius distribution. No crepitus, effusion, warmth, nodules, or bony abnormalities. RANGE OF MOTION: Lumbar flexion, extension, lateral bending, and rotation full and symmetric. Discomfort elicited with end-range flexion. STRENGTH: 5/5 strength in bilateral hip flexion, knee extension, ankle dorsiflexion, plantarflexion, and great toe extension. No weakness noted. NEUROVASCULAR: Sensation intact to light touch throughout bilateral lower extremities.  SPECIAL TESTS: Straight leg raise: Positive on the left, negative on the right., Piriformis test: Positive on the left, benign on the right. FABER test: Localizes laterally on the left, negative on the right. FADIR test: Negative bilaterally. Iliopsoas testing: Minor anterolateral thigh discomfort on the left. Kemp's test: Negative bilaterally.  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 - Prescribed gabapentin  100 mg as needed up to three times a day for nerve pain, cautioning about drowsiness. - Provided exercises via [facility] separate MyChart message to stretch and stabilize the affected area. - Schedule  follow-up in one month to assess progress.

## 2024-06-29 NOTE — Patient Instructions (Signed)
 VISIT SUMMARY:  Today, we discussed your low back pain, recent weight loss, and persistent upper respiratory symptoms. We have started a treatment plan to address these issues and scheduled a follow-up appointment in one month to monitor your progress.  YOUR PLAN:  LEFT-SIDED SCIATICA: Chronic left-sided sciatica likely due to nerve compression from a bulged or slipped disc or piriformis muscle tightness. -Take gabapentin  as needed up to three times a day for nerve pain. Be cautious as it may cause drowsiness. -Perform the provided exercises to stretch and stabilize the affected area. See separate MyChart Message. -Follow up in one month to assess progress.  OBESITY: Obesity management with medication. Discussed weight loss medication, emphasizing adherence to body cues to avoid side effects. -Start the Wegovy  medication as discussed. -Follow your body cues to avoid overeating. -Be aware of potential side effects like slowed bowel movements, bloating, pain, and reflux. Manage these with fiber supplements or MiraLAX if needed. See separate MyChart Message.  ALLERGIC RHINITIS: Symptoms include runny nose, sneezing, and cough persisting for three weeks. -Start taking Claritin, Zyrtec , or Allegra. -Use Flonase  nasal spray. -Consider using a nasal antihistamine if available.

## 2024-06-29 NOTE — Assessment & Plan Note (Signed)
 Upper respiratory symptoms - Feeling symptoms for the past three weeks - Initial cough that has resolved - Persistent runny nose and sneezing - No medication taken for allergies  Allergic rhinitis Symptoms include runny nose, sneezing, and cough persisting for three weeks. - Recommend starting Claritin, Zyrtec , or Allegra. - Advise using Flonase  nasal spray. - Consider restarting Astelin  Rx nasal spray.

## 2024-07-19 ENCOUNTER — Inpatient Hospital Stay: Attending: Oncology

## 2024-07-19 DIAGNOSIS — N920 Excessive and frequent menstruation with regular cycle: Secondary | ICD-10-CM | POA: Diagnosis not present

## 2024-07-19 DIAGNOSIS — D500 Iron deficiency anemia secondary to blood loss (chronic): Secondary | ICD-10-CM | POA: Diagnosis not present

## 2024-07-19 LAB — CBC WITH DIFFERENTIAL (CANCER CENTER ONLY)
Abs Immature Granulocytes: 0.03 K/uL (ref 0.00–0.07)
Basophils Absolute: 0 K/uL (ref 0.0–0.1)
Basophils Relative: 0 %
Eosinophils Absolute: 0.1 K/uL (ref 0.0–0.5)
Eosinophils Relative: 1 %
HCT: 40.6 % (ref 36.0–46.0)
Hemoglobin: 13.3 g/dL (ref 12.0–15.0)
Immature Granulocytes: 0 %
Lymphocytes Relative: 27 %
Lymphs Abs: 2 K/uL (ref 0.7–4.0)
MCH: 27.5 pg (ref 26.0–34.0)
MCHC: 32.8 g/dL (ref 30.0–36.0)
MCV: 83.9 fL (ref 80.0–100.0)
Monocytes Absolute: 0.4 K/uL (ref 0.1–1.0)
Monocytes Relative: 6 %
Neutro Abs: 4.9 K/uL (ref 1.7–7.7)
Neutrophils Relative %: 66 %
Platelet Count: 358 K/uL (ref 150–400)
RBC: 4.84 MIL/uL (ref 3.87–5.11)
RDW: 13 % (ref 11.5–15.5)
WBC Count: 7.4 K/uL (ref 4.0–10.5)
nRBC: 0 % (ref 0.0–0.2)

## 2024-07-19 LAB — IRON AND TIBC
Iron: 40 ug/dL (ref 28–170)
Saturation Ratios: 10 % — ABNORMAL LOW (ref 10.4–31.8)
TIBC: 412 ug/dL (ref 250–450)
UIBC: 372 ug/dL

## 2024-07-19 LAB — FERRITIN: Ferritin: 7 ng/mL — ABNORMAL LOW (ref 11–307)

## 2024-07-21 ENCOUNTER — Other Ambulatory Visit

## 2024-07-24 ENCOUNTER — Inpatient Hospital Stay

## 2024-07-24 ENCOUNTER — Ambulatory Visit: Admitting: Oncology

## 2024-07-25 ENCOUNTER — Encounter: Payer: Self-pay | Admitting: Oncology

## 2024-07-25 ENCOUNTER — Inpatient Hospital Stay (HOSPITAL_BASED_OUTPATIENT_CLINIC_OR_DEPARTMENT_OTHER): Admitting: Oncology

## 2024-07-25 ENCOUNTER — Inpatient Hospital Stay

## 2024-07-25 VITALS — BP 121/74 | HR 92 | Temp 97.3°F | Resp 16 | Wt 243.0 lb

## 2024-07-25 DIAGNOSIS — N920 Excessive and frequent menstruation with regular cycle: Secondary | ICD-10-CM | POA: Diagnosis not present

## 2024-07-25 DIAGNOSIS — D500 Iron deficiency anemia secondary to blood loss (chronic): Secondary | ICD-10-CM

## 2024-07-25 MED ORDER — IRON SUCROSE 20 MG/ML IV SOLN
200.0000 mg | Freq: Once | INTRAVENOUS | Status: AC
  Administered 2024-07-25: 200 mg via INTRAVENOUS
  Filled 2024-07-25: qty 10

## 2024-07-25 NOTE — Progress Notes (Signed)
 Hematology/Oncology Progress note [phone] [phone]         Patient Care Team: Alvia Selinda PARAS, MD as PCP - General (Family Medicine) Babara Call, MD as Consulting Physician (Hematology and Oncology)   REFERRING PROVIDER: Alvia Selinda PARAS, MD  CHIEF COMPLAINTS/REASON FOR VISIT:  Anemia  ASSESSMENT & PLAN:   Iron  deficiency anemia due to chronic blood loss Labs are reviewed and discussed with patient. Lab Results  Component Value Date   HGB 13.3 07/19/2024   TIBC 412 07/19/2024   IRONPCTSAT 10 (L) 07/19/2024   FERRITIN 7 (L) 07/19/2024    Recommend additional  IV venofer  weekly x 3  Orders Placed This Encounter  Procedures   CBC with Differential (Cancer Center Only)    Standing Status:   Future    Expected Date:   11/25/2024    Expiration Date:   02/23/2025   Iron  and TIBC    Standing Status:   Future    Expected Date:   11/25/2024    Expiration Date:   02/23/2025   Ferritin    Standing Status:   Future    Expected Date:   11/25/2024    Expiration Date:   02/23/2025   Follow-up in4  months  All questions were answered. The patient knows to call the clinic with any problems, questions or concerns.  Call Babara, MD, PhD [facility] Hematology Oncology 07/25/2024     HISTORY OF PRESENTING ILLNESS:  Kara House is a  23 y.o.  female with PMH listed below who was referred to me for anemia Reviewed patient's recent labs that was done.  Patient has a history of iron  deficient anemia.  Most recently on 11/02/2023, CBC showed decreased hemoglobin of 10.3, MCV 74, iron  saturation 4, ferritin 4.  TIBC 447. Patient reports feeling very fatigued, with dizziness.  She denies recent chest pain on exertion,  pre-syncopal episodes, or palpitations She had not noticed any recent bleeding such as epistaxis, hematuria or hematochezia.  She reports that menstrual period usually used to be heavy and currently flow is quite light. She denies over the  counter NSAID ingestion. She is not on antiplatelets agents. Patient has tried oral iron  supplementation unable to tolerate due to GI toxicity-nausea  She is married and has 2 children.  Youngest child is 61 years old.  She has a copper IUD.  INTERVAL HISTORY Kara House is a 23 y.o. female who has above history reviewed by me today presents for follow up visit for iron  deficiency anemia.  Discussed the use of AI scribe software for clinical note transcription with the patient, who gave verbal consent to proceed.   She tolerated IV Venofer  treatments well.  No side effects.  Fatigue has improved.  She experiences heavy menstrual bleeding, which she attributes to her copper IUD. She is considering having the IUD removed due to the severity of the bleeding. She was advised by her gynecologist to take ibuprofen to reduce blood flow  MEDICAL HISTORY:  Past Medical History:  Diagnosis Date   Prediabetes     SURGICAL HISTORY: Past Surgical History:  Procedure Laterality Date   PARATHYROIDECTOMY  11/2021   TONSILLECTOMY      SOCIAL HISTORY: Social History   Socioeconomic History   Marital status: Married    Spouse name: Not on file   Number of children: Not on file   Years of education: Not on file   Highest education level: Not on file  Occupational History   Not on  file  Tobacco Use   Smoking status: Never   Smokeless tobacco: Never  Vaping Use   Vaping status: Never Used  Substance and Sexual Activity   Alcohol use: Yes    Comment: occasional   Drug use: Never   Sexual activity: Yes  Other Topics Concern   Not on file  Social History Narrative   Not on file   Social Drivers of Health   Financial Resource Strain: Not on file  Food Insecurity: No Food Insecurity (11/19/2023)   Hunger Vital Sign    Worried About Running Out of Food in the Last Year: Never true    Ran Out of Food in the Last Year: Never true  Transportation Needs: No Transportation Needs (11/19/2023)    PRAPARE - Administrator, Civil Service (Medical): No    Lack of Transportation (Non-Medical): No  Physical Activity: Not on file  Stress: Not on file  Social Connections: Not on file  Intimate Partner Violence: Not At Risk (11/19/2023)   Humiliation, Afraid, Rape, and Kick questionnaire    Fear of Current or Ex-Partner: No    Emotionally Abused: No    Physically Abused: No    Sexually Abused: No    FAMILY HISTORY: Family History  Problem Relation Age of Onset   Stomach cancer Maternal Grandmother     ALLERGIES:  has no known allergies.  MEDICATIONS:  Current Outpatient Medications  Medication Sig Dispense Refill   fluocinonide  (LIDEX ) 0.05 % external solution APPLY 1 TO 2 DROPS TO AFFECTED AREAS ON SCALP ONCE TO TWICE DAILY FOR ITCHING 60 mL 0   gabapentin  (NEURONTIN ) 100 MG capsule Take 1-3 capsules (100-300 mg total) by mouth daily as needed. 60 capsule 0   ketoconazole  (NIZORAL ) 2 % shampoo Shampoo into scalp let sit 5-10 minutes then wash out. Use 2-3 days per week. 120 mL 5   metroNIDAZOLE  (METROCREAM ) 0.75 % cream For acne/rosacea apply to the face QD-BID. 45 g 5   mometasone  (ELOCON ) 0.1 % cream Apply to hands QHS and cover with gloves PRN. 45 g 1   pantoprazole  (PROTONIX ) 40 MG tablet Take 1 tablet (40 mg total) by mouth daily as needed. 30+ minutes prior to dinner on empty stomach. 90 tablet 3   semaglutide -weight management (WEGOVY ) 0.25 MG/0.5ML SOAJ SQ injection Inject 0.25 mg into the skin once a week. 2 mL 1   valACYclovir  (VALTREX ) 1000 MG tablet 2 grams every 12 hours for 2 doses with a glass of water at the first sign of symptoms. 30 tablet 0   azelastine  (ASTELIN ) 0.1 % nasal spray Place 2 sprays into both nostrils 2 (two) times daily. Use in each nostril as directed (Patient not taking: Reported on 02/23/2024) 30 mL 1   benzonatate  (TESSALON ) 100 MG capsule Take 1 capsule (100 mg total) by mouth 2 (two) times daily as needed for cough. (Patient not  taking: Reported on 02/23/2024) 20 capsule 0   doxycycline  (VIBRA -TABS) 100 MG tablet Take 1 tablet (100 mg total) by mouth 2 (two) times daily. (Patient not taking: Reported on 06/29/2024) 60 tablet 4   No current facility-administered medications for this visit.    Review of Systems  Constitutional:  Positive for fatigue. Negative for appetite change, chills and fever.  HENT:   Negative for hearing loss and voice change.   Eyes:  Negative for eye problems.  Respiratory:  Negative for chest tightness and cough.   Cardiovascular:  Negative for chest pain.  Gastrointestinal:  Negative  for abdominal distention, abdominal pain and blood in stool.  Endocrine: Negative for hot flashes.  Genitourinary:  Positive for menstrual problem. Negative for difficulty urinating and frequency.   Musculoskeletal:  Negative for arthralgias.  Skin:  Negative for itching and rash.  Neurological:  Negative for dizziness and extremity weakness.  Hematological:  Negative for adenopathy.  Psychiatric/Behavioral:  Negative for confusion.     PHYSICAL EXAMINATION: Vitals:   07/25/24 1032  BP: 121/74  Pulse: 92  Resp: 16  Temp: (!) 97.3 F (36.3 C)  SpO2: 100%   Filed Weights   07/25/24 1032  Weight: 243 lb (110.2 kg)    Physical Exam Constitutional:      General: She is not in acute distress. HENT:     Head: Normocephalic and atraumatic.  Eyes:     General: No scleral icterus. Cardiovascular:     Rate and Rhythm: Normal rate and regular rhythm.     Heart sounds: Normal heart sounds.  Pulmonary:     Effort: Pulmonary effort is normal. No respiratory distress.     Breath sounds: No wheezing.  Abdominal:     General: Bowel sounds are normal. There is no distension.     Palpations: Abdomen is soft.  Musculoskeletal:        General: No deformity. Normal range of motion.     Cervical back: Normal range of motion and neck supple.  Skin:    General: Skin is warm and dry.     Findings: No  erythema or rash.  Neurological:     Mental Status: She is alert and oriented to person, place, and time. Mental status is at baseline.     Cranial Nerves: No cranial nerve deficit.     Coordination: Coordination normal.  Psychiatric:        Mood and Affect: Mood normal.      LABORATORY DATA:  I have reviewed the data as listed    Latest Ref Rng & Units 07/19/2024    9:47 AM 02/22/2024    3:27 PM 12/30/2023   12:43 PM  CBC  WBC 4.0 - 10.5 K/uL 7.4  6.5  4.6   Hemoglobin 12.0 - 15.0 g/dL 86.6  87.4  87.0   Hematocrit 36.0 - 46.0 % 40.6  39.5  41.5   Platelets 150 - 400 K/uL 358  402  347       Latest Ref Rng & Units 12/30/2023   12:43 PM 11/02/2023    8:41 AM 04/24/2023    9:13 PM  CMP  Glucose 70 - 99 mg/dL 92  80  891   BUN 6 - 20 mg/dL 7  9  12    Creatinine 0.44 - 1.00 mg/dL 9.44  9.49  9.47   Sodium 135 - 145 mmol/L 133  140  136   Potassium 3.5 - 5.1 mmol/L 3.6  4.7  3.5   Chloride 98 - 111 mmol/L 104  102  104   CO2 22 - 32 mmol/L 22  21  22    Calcium 8.9 - 10.3 mg/dL 8.5  9.0  9.1   Total Protein 6.0 - 8.5 g/dL  7.0  8.8   Total Bilirubin 0.0 - 1.2 mg/dL  <9.7  0.5   Alkaline Phos 44 - 121 IU/L  126  114   AST 0 - 40 IU/L  17  20   ALT 0 - 32 IU/L  18  21    Lab Results  Component Value Date   IRON   40 07/19/2024   TIBC 412 07/19/2024   IRONPCTSAT 10 (L) 07/19/2024   FERRITIN 7 (L) 07/19/2024     RADIOGRAPHIC STUDIES: I have personally reviewed the radiological images as listed and agreed with the findings in the report. No results found.

## 2024-07-25 NOTE — Assessment & Plan Note (Addendum)
 Labs are reviewed and discussed with patient. Lab Results  Component Value Date   HGB 13.3 07/19/2024   TIBC 412 07/19/2024   IRONPCTSAT 10 (L) 07/19/2024   FERRITIN 7 (L) 07/19/2024    Recommend additional  IV venofer  weekly x 3

## 2024-07-25 NOTE — Progress Notes (Signed)
 Pt in for follow up, reports no longer feeling fatigued, energy levels are improved greatly.

## 2024-07-26 ENCOUNTER — Encounter: Payer: Self-pay | Admitting: Family Medicine

## 2024-07-26 ENCOUNTER — Encounter: Payer: Self-pay | Admitting: Dermatology

## 2024-07-27 ENCOUNTER — Ambulatory Visit: Admitting: Family Medicine

## 2024-07-31 ENCOUNTER — Other Ambulatory Visit: Payer: Self-pay

## 2024-07-31 DIAGNOSIS — M5417 Radiculopathy, lumbosacral region: Secondary | ICD-10-CM

## 2024-07-31 MED ORDER — VALACYCLOVIR HCL 1 G PO TABS: ORAL_TABLET | ORAL | 0 refills | Status: DC

## 2024-07-31 MED ORDER — KETOCONAZOLE 2 % EX SHAM: MEDICATED_SHAMPOO | CUTANEOUS | 5 refills | Status: AC

## 2024-07-31 MED ORDER — GABAPENTIN 100 MG PO CAPS: 100.0000 mg | ORAL_CAPSULE | Freq: Every day | ORAL | 0 refills | Status: AC | PRN

## 2024-07-31 NOTE — Progress Notes (Signed)
 Pt needed prescriptions [facility]

## 2024-08-01 ENCOUNTER — Encounter: Payer: Self-pay | Admitting: Family Medicine

## 2024-08-01 ENCOUNTER — Ambulatory Visit: Admitting: Family Medicine

## 2024-08-01 VITALS — BP 104/76 | HR 103 | Temp 97.9°F | Ht 63.0 in | Wt 243.2 lb

## 2024-08-01 DIAGNOSIS — Z6841 Body Mass Index (BMI) 40.0 and over, adult: Secondary | ICD-10-CM | POA: Diagnosis not present

## 2024-08-01 DIAGNOSIS — H01116 Allergic dermatitis of left eye, unspecified eyelid: Secondary | ICD-10-CM | POA: Diagnosis not present

## 2024-08-01 DIAGNOSIS — R5382 Chronic fatigue, unspecified: Secondary | ICD-10-CM | POA: Insufficient documentation

## 2024-08-01 DIAGNOSIS — Z0000 Encounter for general adult medical examination without abnormal findings: Secondary | ICD-10-CM | POA: Diagnosis not present

## 2024-08-01 DIAGNOSIS — E66813 Obesity, class 3: Secondary | ICD-10-CM | POA: Diagnosis not present

## 2024-08-01 MED ORDER — ONDANSETRON 4 MG PO TBDP: 4.0000 mg | ORAL_TABLET | Freq: Three times a day (TID) | ORAL | 0 refills | Status: AC | PRN

## 2024-08-01 MED ORDER — OLOPATADINE HCL 0.1 % OP SOLN: 1.0000 [drp] | Freq: Two times a day (BID) | OPHTHALMIC | 12 refills | Status: DC

## 2024-08-01 MED ORDER — SEMAGLUTIDE-WEIGHT MANAGEMENT 0.25 MG/0.5ML ~~LOC~~ SOAJ: 0.2500 mg | SUBCUTANEOUS | 1 refills | Status: DC

## 2024-08-01 NOTE — Assessment & Plan Note (Signed)
 Kara House is a 23 year old female who presents for a weight check.  Appetite and weight changes - Reduced appetite since starting current Wegovy  0.25 mg - Uncertain if weight loss has occurred - Less fatigue, attributed to increased walking and recent iron  infusions - Last iron  infusion on July 25, 2024  Medication tolerance and adverse effects - On lowest dose of current medication, administered every two weeks instead of weekly due to nausea with weekly dosing - Bi-weekly dosing allows for normal bowel movements - Experiences nausea, particularly after eating certain foods - Previously used Zofran  for nausea management  Obesity managed with weight loss medication and associated nausea Obesity managed with Wegovy , causing nausea. Biweekly dosing improved bowel movements. Medicaid no longer covers Wegovy . Alternatives considered if sleep apnea confirmed. - Continue Wegovy  weekly as tolerated. - Prescribe Zofran  for nausea. - Increase fiber intake. - Send Wegovy  prescription; explore alternatives if not covered. - Refer to Sleep Medicine for sleep apnea evaluation. - Consider Zepbound if sleep apnea confirmed.

## 2024-08-01 NOTE — Progress Notes (Signed)
 Primary Care / Sports Medicine Office Visit  Patient Information:  Patient ID: Kara House, female DOB: October 10, 2001 Age: 23 y.o. MRN: [id number]   Kara House is a pleasant 23 y.o. female presenting with the following:  Chief Complaint  Patient presents with   Weight Management Screening    Patient presents today for a weight check. She is taking Wegovy  0.25 mg dose. She does not have any side effects and would like to go up in dosage.    Vitals:   08/01/24 1019  BP: 104/76  Pulse: (!) 103  Temp: 97.9 F (36.6 C)  SpO2: 97%   Vitals:   08/01/24 1019  Weight: 243 lb 3.2 oz (110.3 kg)  Height: 5' 3 (1.6 m)   Body mass index is 43.08 kg/m.  No results found.   Discussed the use of AI scribe software for clinical note transcription with the patient, who gave verbal consent to proceed.   Independent interpretation of notes and tests performed by another provider:   None  Procedures performed:   None  Pertinent History, Exam, Impression, and Recommendations:   Problem List Items Addressed This Visit     Allergic blepharitis, left   Ocular symptoms - Occasional swelling and pain in left eye, lasting up to one week - Pain sometimes associated with bleeding - No itching, discharge, or redness  Physical Exam HEENT: Left eye with inferior lid with subtle swelling, no erythema. Symmetrical erythema of palpebral conjunctival membranes. No conjunctival erythema. Superior lid and margins benign. No discharge.  Allergic Blepharitis, left lower eyelid Intermittent swelling and pain in left lower eyelid, consistent with allergic blepharitis. No discharge or erythema. - Prescribe eye drops for symptomatic relief. - Advise on proper eye hygiene and avoiding rubbing eyes.      Relevant Medications   olopatadine (PATANOL) 0.1 % ophthalmic solution   Chronic fatigue   Chronic fatigue in the setting of Body mass index is 43.08 kg/m., iron  deficiency anemia  requiring infusions, and reported snoring and choking during sleep.  Suspected sleep apnea Suspected due to snoring and choking during sleep. Confirmation could provide alternative indication for weight loss medication. - Refer to Sleep Medicine for evaluation and home sleep test. - Consider Zepbound if sleep apnea confirmed.      Relevant Orders   Ambulatory referral to Sleep Studies   Class 3 severe obesity with serious comorbidity and body mass index (BMI) of 40.0 to 44.9 in adult (HCC) - Primary    >>ASSESSMENT AND PLAN FOR BMI 40.0-44.9, ADULT (HCC) WRITTEN ON 08/01/2024 10:58 AM BY Jakeisha Stricker J, MD  Kara House is a 23 year old female who presents for a weight check.  Appetite and weight changes - Reduced appetite since starting current Wegovy  0.25 mg - Uncertain if weight loss has occurred - Less fatigue, attributed to increased walking and recent iron  infusions - Last iron  infusion on July 25, 2024  Medication tolerance and adverse effects - On lowest dose of current medication, administered every two weeks instead of weekly due to nausea with weekly dosing - Bi-weekly dosing allows for normal bowel movements - Experiences nausea, particularly after eating certain foods - Previously used Zofran  for nausea management  Obesity managed with weight loss medication and associated nausea Obesity managed with Wegovy , causing nausea. Biweekly dosing improved bowel movements. Medicaid no longer covers Wegovy . Alternatives considered if sleep apnea confirmed. - Continue Wegovy  weekly as tolerated. - Prescribe Zofran  for nausea. - Increase fiber intake. - Send  Wegovy  prescription; explore alternatives if not covered. - Refer to Sleep Medicine for sleep apnea evaluation. - Consider Zepbound if sleep apnea confirmed.      Relevant Medications   semaglutide -weight management (WEGOVY ) 0.25 MG/0.5ML SOAJ SQ injection   Other Relevant Orders   Ambulatory referral to Sleep  Studies   Healthcare maintenance   Dental care needs - Requests referral to a dentist after being unable to return to previous provider due to a missed appointment      Relevant Orders   Ambulatory referral to Dentistry     Orders & Medications Medications:  Meds ordered this encounter  Medications   semaglutide -weight management (WEGOVY ) 0.25 MG/0.5ML SOAJ SQ injection    Sig: Inject 0.25 mg into the skin once a week.    Dispense:  2 mL    Refill:  1   olopatadine (PATANOL) 0.1 % ophthalmic solution    Sig: Place 1 drop into the left eye 2 (two) times daily. Discontinue use and contact doctor if eye pain or changes in vision occur, redness or irritation of the eye continues, or condition worsens or persists for >3 days. Do not use if solution changes color or becomes cloudy.    Dispense:  5 mL    Refill:  12   ondansetron  (ZOFRAN -ODT) 4 MG disintegrating tablet    Sig: Take 1 tablet (4 mg total) by mouth every 8 (eight) hours as needed for up to 10 days for nausea or vomiting.    Dispense:  20 tablet    Refill:  0   Orders Placed This Encounter  Procedures   Ambulatory referral to Dentistry   Ambulatory referral to Sleep Studies     No follow-ups on file.     Selinda JINNY Ku, MD, [facility]   Primary Care Sports Medicine Primary Care and Sports Medicine at [facility]

## 2024-08-01 NOTE — Patient Instructions (Addendum)
 VISIT SUMMARY:  Today, we discussed your weight management, medication tolerance, potential sleep apnea, and eye discomfort. We made adjustments to your treatment plan to better address these issues.  YOUR PLAN:  OBESITY: Your weight is being managed with Wegovy , but it has been causing nausea. We are considering alternatives if sleep apnea is confirmed. -Continue taking Wegovy  weekly as tolerated. -Prescribe Zofran  to manage nausea. -Increase your fiber intake. -We will send the Wegovy  prescription and explore alternatives if it is not covered by Medicaid. -You will be referred to Sleep Medicine for a sleep apnea evaluation. -Consider Zepbound if sleep apnea is confirmed.  SUSPECTED SLEEP APNEA: You have symptoms like snoring and choking during sleep, which may indicate sleep apnea. -You will be referred to Sleep Medicine for an evaluation and a home sleep test.  ALLERGIC BLEPHARITIS, LEFT LOWER EYELID: You have intermittent swelling and pain in your left lower eyelid, consistent with blepharitis. -Prescribe eye drops for relief. -Maintain proper eye hygiene and avoid rubbing your eyes.  DENTAL CARE: You need a referral to a dentist after missing an appointment with your previous provider. -We will provide a referral to a dentist.

## 2024-08-01 NOTE — Assessment & Plan Note (Signed)
 Dental care needs - Requests referral to a dentist after being unable to return to previous provider due to a missed appointment

## 2024-08-01 NOTE — Assessment & Plan Note (Signed)
 Chronic fatigue in the setting of Body mass index is 43.08 kg/m., iron  deficiency anemia requiring infusions, and reported snoring and choking during sleep.  Suspected sleep apnea Suspected due to snoring and choking during sleep. Confirmation could provide alternative indication for weight loss medication. - Refer to Sleep Medicine for evaluation and home sleep test. - Consider Zepbound if sleep apnea confirmed.

## 2024-08-01 NOTE — Assessment & Plan Note (Addendum)
 Ocular symptoms - Occasional swelling and pain in left eye, lasting up to one week - Pain sometimes associated with bleeding - No itching, discharge, or redness  Physical Exam HEENT: Left eye with inferior lid with subtle swelling, no erythema. Symmetrical erythema of palpebral conjunctival membranes. No conjunctival erythema. Superior lid and margins benign. No discharge.  Allergic Blepharitis, left lower eyelid Intermittent swelling and pain in left lower eyelid, consistent with allergic blepharitis. No discharge or erythema. - Prescribe eye drops for symptomatic relief. - Advise on proper eye hygiene and avoiding rubbing eyes.

## 2024-08-01 NOTE — Assessment & Plan Note (Signed)
>>  ASSESSMENT AND PLAN FOR BMI 40.0-44.9, ADULT (HCC) WRITTEN ON 08/01/2024 10:58 AM BY Oluwatimilehin Balfour J, MD  Kara House is a 23 year old female who presents for a weight check.  Appetite and weight changes - Reduced appetite since starting current Wegovy  0.25 mg - Uncertain if weight loss has occurred - Less fatigue, attributed to increased walking and recent iron  infusions - Last iron  infusion on July 25, 2024  Medication tolerance and adverse effects - On lowest dose of current medication, administered every two weeks instead of weekly due to nausea with weekly dosing - Bi-weekly dosing allows for normal bowel movements - Experiences nausea, particularly after eating certain foods - Previously used Zofran  for nausea management  Obesity managed with weight loss medication and associated nausea Obesity managed with Wegovy , causing nausea. Biweekly dosing improved bowel movements. Medicaid no longer covers Wegovy . Alternatives considered if sleep apnea confirmed. - Continue Wegovy  weekly as tolerated. - Prescribe Zofran  for nausea. - Increase fiber intake. - Send Wegovy  prescription; explore alternatives if not covered. - Refer to Sleep Medicine for sleep apnea evaluation. - Consider Zepbound if sleep apnea confirmed.

## 2024-08-02 NOTE — Addendum Note (Signed)
 Addended by: Lashaun Krapf J on: 08/02/2024 09:59 AM   Modules accepted: Level of Service

## 2024-08-07 ENCOUNTER — Inpatient Hospital Stay

## 2024-08-07 VITALS — BP 109/75 | HR 88 | Temp 96.5°F | Resp 19

## 2024-08-07 DIAGNOSIS — N920 Excessive and frequent menstruation with regular cycle: Secondary | ICD-10-CM | POA: Diagnosis not present

## 2024-08-07 DIAGNOSIS — D500 Iron deficiency anemia secondary to blood loss (chronic): Secondary | ICD-10-CM

## 2024-08-07 MED ORDER — SODIUM CHLORIDE 0.9% FLUSH
10.0000 mL | Freq: Once | INTRAVENOUS | Status: AC | PRN
  Administered 2024-08-07: 10 mL
  Filled 2024-08-07: qty 10

## 2024-08-07 MED ORDER — IRON SUCROSE 20 MG/ML IV SOLN
200.0000 mg | Freq: Once | INTRAVENOUS | Status: AC
  Administered 2024-08-07: 200 mg via INTRAVENOUS
  Filled 2024-08-07: qty 10

## 2024-08-14 ENCOUNTER — Telehealth: Payer: Self-pay | Admitting: *Deleted

## 2024-08-14 DIAGNOSIS — Z0000 Encounter for general adult medical examination without abnormal findings: Secondary | ICD-10-CM

## 2024-08-14 NOTE — Progress Notes (Signed)
 Complex Care Management Note  Care Guide Note 08/14/2024 Name: Waniya Hoglund MRN: [id number] DOB: 2001-06-12  Shakora Nordquist is a 23 y.o. year old female who sees Alvia Selinda PARAS, MD for primary care. I reached out to Ames Rasmussen by phone today to offer complex care management services.  Ms. Nichelson was given information about Complex Care Management services today including:   The Complex Care Management services include support from the care team which includes your Nurse Care Manager, Clinical Social Worker, or Pharmacist.  The Complex Care Management team is here to help remove barriers to the health concerns and goals most important to you. Complex Care Management services are voluntary, and the patient may decline or stop services at any time by request to their care team member.   Complex Care Management Consent Status: Patient agreed to services and verbal consent obtained.   Follow up plan:  Telephone appointment with complex care management team member scheduled for:  08/24/2024  Encounter Outcome:  Patient Scheduled  Thedford Franks, CMA [place]  [facility] Guide Direct Dial: [phone]  Fax: [phone] Website: [email or web address]

## 2024-08-21 ENCOUNTER — Inpatient Hospital Stay

## 2024-08-22 ENCOUNTER — Ambulatory Visit: Admitting: Sleep Medicine

## 2024-08-24 ENCOUNTER — Other Ambulatory Visit: Payer: Self-pay

## 2024-08-24 NOTE — Patient Outreach (Signed)
 08/24/2024  Name:  Kara House MRN: [id number] DOB: 2001/06/14   [name] was referred and scheduled by the Care Guide for Complex Care Management nursing outreach.  Brief outreach with [name] today. Reports managing her health well. Denies needs related to care management or social drivers of health. Reports Wegovy  is currently on hold due to changes with insurance coverage. Anticipates resuming this prescription after coverage changes are finalized. Declined need for nursing, counseling or pharmacy outreach.   PLAN Ms. Leontine declined need for additional outreach. Agreed to call or contact her Primary Care Provider if her health needs change and care management assistance is needed. The care team will gladly assist.   Jackson Acron [facility] RN Care Manager Direct Dial: [phone]  Fax: [phone] Website: [email or web address]

## 2024-08-30 ENCOUNTER — Ambulatory Visit: Admitting: Sleep Medicine

## 2024-08-30 ENCOUNTER — Inpatient Hospital Stay: Attending: Oncology

## 2024-08-30 VITALS — BP 112/85 | HR 86 | Temp 97.9°F | Resp 18

## 2024-08-30 DIAGNOSIS — N920 Excessive and frequent menstruation with regular cycle: Secondary | ICD-10-CM | POA: Insufficient documentation

## 2024-08-30 DIAGNOSIS — D500 Iron deficiency anemia secondary to blood loss (chronic): Secondary | ICD-10-CM | POA: Diagnosis not present

## 2024-08-30 MED ORDER — IRON SUCROSE 20 MG/ML IV SOLN
200.0000 mg | Freq: Once | INTRAVENOUS | Status: AC
  Administered 2024-08-30: 200 mg via INTRAVENOUS

## 2024-08-30 NOTE — Patient Instructions (Signed)

## 2024-09-01 ENCOUNTER — Ambulatory Visit: Admitting: Licensed Practical Nurse

## 2024-09-01 VITALS — BP 119/76 | HR 99 | Ht 63.0 in | Wt 244.8 lb

## 2024-09-01 DIAGNOSIS — Z30432 Encounter for removal of intrauterine contraceptive device: Secondary | ICD-10-CM

## 2024-09-01 NOTE — Progress Notes (Unsigned)
    GYNECOLOGY OFFICE PROCEDURE NOTE BP 119/76   Pulse 99   Ht 5' 3 (1.6 m)   Wt 244 lb 12.8 oz (111 kg)   LMP 08/27/2024 (Approximate)   BMI 43.36 kg/m   Kara House is a 23 y.o. [id number] here for Paragard IUD removal. She has had heavy periods and now is anemic requiring iron  infusions, she also has some cramping.  Last pap smear was on Not on file   She was seen in February for same concern and was advised to try NSAIDS.   She is not sure what she would like to do for contraception. She does not desire a pregnancy right now. She does not use tobacco, denies hx HTN or Migraines with aura.   IUD Removal  Patient identified, informed consent performed, consent signed.  Patient was in the dorsal lithotomy position, normal external genitalia was noted.  A speculum was placed in the patient's vagina, normal discharge was noted, no lesions. The cervix was visualized, no lesions, no abnormal discharge.  The strings of the IUD were grasped and pulled using ring forceps. The IUD was removed in its entirety.  Patient tolerated the procedure well.    Patient will use condoms for contraception  Routine preventative health maintenance measures emphasized.  Pt will return if she desires contraception    Jinnie Cookey, CNM  [facility] 09/02/24  1:00 PM

## 2024-09-14 ENCOUNTER — Encounter: Payer: Self-pay | Admitting: Sleep Medicine

## 2024-09-14 ENCOUNTER — Ambulatory Visit: Admitting: Sleep Medicine

## 2024-09-14 VITALS — BP 118/80 | HR 94 | Temp 98.5°F | Ht 63.0 in | Wt 243.6 lb

## 2024-09-14 DIAGNOSIS — G4710 Hypersomnia, unspecified: Secondary | ICD-10-CM | POA: Diagnosis not present

## 2024-09-14 DIAGNOSIS — Z6841 Body Mass Index (BMI) 40.0 and over, adult: Secondary | ICD-10-CM | POA: Diagnosis not present

## 2024-09-14 DIAGNOSIS — G4733 Obstructive sleep apnea (adult) (pediatric): Secondary | ICD-10-CM

## 2024-09-14 NOTE — Progress Notes (Signed)
 Name:Kara House MRN: [id number] DOB: 30-May-2001   CHIEF COMPLAINT:  EXCESSIVE DAYTIME SLEEPINESS   HISTORY OF PRESENT ILLNESS: [name] is a 23 y.o. w/ a h/o morbid obesity and GERD who present for c/o loud snoring and excessive daytime sleepiness which has been present for several years. Reports occasional nocturnal awakenings due to nocturia and has difficulty falling back to sleep. Reports significant weight changes. Admits to morning headaches. Denies RLS symptoms, dream enactment, cataplexy, hypnagogic or hypnapompic hallucinations. Denies a family history of sleep apnea. Denies drowsy driving. Drinks 1 cup of coffee daily, occasional alcohol use, denies tobacco or illicit drug use.   Bedtime 9-9:30 pm Sleep onset 1 hour Rise time 5-6 am   EPWORTH SLEEP SCORE 5    09/14/2024   10:04 AM  Results of the Epworth flowsheet  Sitting and reading 1  Watching TV 1  Sitting, inactive in a public place (e.g. a theatre or a meeting) 0  As a passenger in a car for an hour without a break 1  Lying down to rest in the afternoon when circumstances permit 2  Sitting and talking to someone 0  Sitting quietly after a lunch without alcohol 0  In a car, while stopped for a few minutes in traffic 0  Total score 5    PAST MEDICAL HISTORY :   has a past medical history of Prediabetes.  has a past surgical history that includes Tonsillectomy and Parathyroidectomy (11/2021). Prior to Admission medications   Medication Sig Start Date End Date Taking? Authorizing Provider  fluocinonide  (LIDEX ) 0.05 % external solution Apply 1 Application topically 2 (two) times daily as needed (Take as needed for itchy scalp).    [provider]  gabapentin  (NEURONTIN ) 100 MG capsule Take 1-3 capsules (100-300 mg total) by mouth daily as needed. 07/31/24   Matthews, Jason J, MD  ketoconazole  (NIZORAL ) 2 % shampoo Shampoo into scalp let sit 5-10 minutes then wash out. Use 2-3 days per week.  07/31/24   Jackquline Sawyer, MD  olopatadine  (PATANOL) 0.1 % ophthalmic solution Place 1 drop into the left eye 2 (two) times daily. Discontinue use and contact doctor if eye pain or changes in vision occur, redness or irritation of the eye continues, or condition worsens or persists for >3 days. Do not use if solution changes color or becomes cloudy. Patient not taking: Reported on 08/24/2024 08/01/24   Alvia Selinda PARAS, MD  pantoprazole  (PROTONIX ) 40 MG tablet Take 1 tablet (40 mg total) by mouth daily as needed. 30+ minutes prior to dinner on empty stomach. 06/21/24   Matthews, Jason J, MD  semaglutide -weight management (WEGOVY ) 0.25 MG/0.5ML SOAJ SQ injection Inject 0.25 mg into the skin once a week. Patient not taking: Reported on 08/24/2024 08/01/24   Alvia Selinda PARAS, MD   No Known Allergies  FAMILY HISTORY:  family history includes Stomach cancer in her maternal grandmother. SOCIAL HISTORY:  reports that she has never smoked. She has never used smokeless tobacco. She reports current alcohol use. She reports that she does not use drugs.   Review of Systems:  Gen:  Denies  fever, sweats, chills weight loss  HEENT: Denies blurred vision, double vision, ear pain, eye pain, hearing loss, nose bleeds, sore throat Cardiac:  No dizziness, chest pain or heaviness, chest tightness,edema, No JVD Resp:   No cough, -sputum production, -shortness of breath,-wheezing, -hemoptysis,  Gi: Denies swallowing difficulty, stomach pain, nausea or vomiting, diarrhea, constipation, bowel incontinence Gu:  Denies bladder incontinence, burning urine Ext:   Denies Joint pain, stiffness or swelling Skin: Denies  skin rash, easy bruising or bleeding or hives Endoc:  Denies polyuria, polydipsia , polyphagia or weight change Psych:   Denies depression, insomnia or hallucinations  Other:  All other systems negative  VITAL SIGNS: BP 118/80   Pulse 94   Temp 98.5 F (36.9 C)   Ht 5' 3 (1.6 m)   Wt 243 lb 9.6  oz (110.5 kg)   LMP 08/27/2024 (Approximate)   SpO2 99%   BMI 43.15 kg/m    Physical Examination:   General Appearance: No distress  EYES PERRLA, EOM intact.   NECK Supple, No JVD Pulmonary: normal breath sounds, No wheezing.  CardiovascularNormal S1,S2.  No m/r/g.   Abdomen: Benign, Soft, non-tender. Skin:   warm, no rashes, no ecchymosis  Extremities: normal, no cyanosis, clubbing. Neuro:without focal findings,  speech normal  PSYCHIATRIC: Mood, affect within normal limits.   ASSESSMENT AND PLAN  OSA I suspect that OSA is likely present due to clinical presentation. Discussed the consequences of untreated sleep apnea. Advised not to drive drowsy for safety of patient and others. Will complete further evaluation with a home sleep study and follow up to review results.    Morbid obesity Counseled patient on diet and lifestyle modification.    MEDICATION ADJUSTMENTS/LABS AND TESTS ORDERED: Recommend Sleep Study   Patient  satisfied with Plan of action and management. All questions answered  Follow up to review HST results and treatment plan.   I spent a total of 50 minutes reviewing chart data, face-to-face evaluation with the patient, counseling and coordination of care as detailed above.    Faith Patricelli, M.D.  Sleep Medicine [facility]

## 2024-09-14 NOTE — Patient Instructions (Signed)
 SABRA

## 2024-10-26 ENCOUNTER — Encounter: Payer: Self-pay | Admitting: Family Medicine

## 2024-10-26 ENCOUNTER — Ambulatory Visit: Admitting: Family Medicine

## 2024-10-26 VITALS — BP 104/70 | HR 103 | Ht 63.0 in | Wt 246.0 lb

## 2024-10-26 DIAGNOSIS — Z0000 Encounter for general adult medical examination without abnormal findings: Secondary | ICD-10-CM

## 2024-10-26 DIAGNOSIS — Z6841 Body Mass Index (BMI) 40.0 and over, adult: Secondary | ICD-10-CM

## 2024-10-26 DIAGNOSIS — E66813 Obesity, class 3: Secondary | ICD-10-CM | POA: Diagnosis not present

## 2024-10-26 DIAGNOSIS — J0100 Acute maxillary sinusitis, unspecified: Secondary | ICD-10-CM | POA: Insufficient documentation

## 2024-10-26 MED ORDER — SEMAGLUTIDE-WEIGHT MANAGEMENT 0.25 MG/0.5ML ~~LOC~~ SOAJ: 0.2500 mg | SUBCUTANEOUS | 0 refills | Status: AC

## 2024-10-26 MED ORDER — MOMETASONE FUROATE 50 MCG/ACT NA SUSP: NASAL | 0 refills | Status: AC

## 2024-10-26 MED ORDER — PROMETHAZINE-DM 6.25-15 MG/5ML PO SYRP: 5.0000 mL | ORAL_SOLUTION | Freq: Four times a day (QID) | ORAL | 0 refills | Status: AC | PRN

## 2024-10-26 MED ORDER — AZELASTINE HCL 0.1 % NA SOLN: 2.0000 | Freq: Two times a day (BID) | NASAL | 0 refills | Status: AC

## 2024-10-26 NOTE — Progress Notes (Signed)
 "    Primary Care / Sports Medicine Office Visit  Patient Information:  Patient ID: Kara House, female DOB: [birth year] Age: 24 y.o. MRN: [id number]   Kara House is a pleasant 24 y.o. female presenting with the following:  Chief Complaint  Patient presents with   Cough    Cough and congestion since Christmas 2025. Patient lost voice from 10/19/24-10/24/24. Productive cough. Patient would like to discuss medication for cough and congestion since it's been going on for so long.     Vitals:   10/26/24 0908  BP: 104/70  Pulse: (!) 103  SpO2: 96%   Vitals:   10/26/24 0908  Weight: 246 lb (111.6 kg)  Height: 5' 3 (1.6 m)   Body mass index is 43.58 kg/m.  No results found.   Independent interpretation of notes and tests performed by another provider:   None  Procedures performed:   None  Pertinent History, Exam, Impression, and Recommendations:   Discussed the use of AI scribe software for clinical note transcription with the patient, who gave verbal consent to proceed.  History of Present Illness   Kara House is a 24 year old female with obesity who presents with persistent upper respiratory symptoms.  Upper Respiratory Symptoms: - Persistent productive cough since Christmas 2025, initially severe, now minimal during daytime - Requires expectoration of mucus upon waking - Sore throat localized to the upper throat, with significant odynophagia early in the course; partial improvement but remains abnormal compared to baseline - Dysphonia present from January 8th to January 13th - Unilateral nasal drainage during sleep - Nasal congestion - Cheek pressure - No dyspnea; able to move air well  Ocular and Otologic Symptoms: - Green crusting in both eyes upon waking - Dry eyes - Bilateral tinnitus - Intermittent pruritus of the ears - Sensation of fluid in the left ear - No otalgia  Current Management and Duration: - Using cough drops and humidifier -  No use of other over-the-counter medications or vitamins - Concerned about prolonged duration of symptoms, persisting for over three weeks     Physical Exam VITALS: SaO2- 96% HEENT: Tympanic membranes are benign bilaterally with subtle fluid behind the left tympanic membrane. Nasopharynx shows turbinate with mild erythema and swelling. Oropharynx appears benign. NECK: No cervical lymphadenopathy. CHEST: Lungs are clear to auscultation bilaterally with no wheezes, rales, or rhonchi. CARDIOVASCULAR: Heart has a regular rate and rhythm with no additional heart sounds.  Assessment and Plan    Acute sinusitis Subacute presentation with persistent upper respiratory symptoms consistent with acute sinusitis. Ongoing sinus inflammation without severe features. Anticipated favorable response to medical management. - Prescribed mometasone  nasal spray, two sprays in each nostril twice daily following saline nasal irrigation. - Recommended saline nasal spray prior to corticosteroid application. - Advised over-the-counter guaifenesin  twice daily and increased oral fluid intake. - Prescribed dextromethorphan/promethazine  syrup as needed, primarily for nocturnal cough. - Recommended continued use of humidifier and optional use of over-the-counter antihistamines (loratadine, cetirizine , fexofenadine) and vitamin C, vitamin D , and zinc supplements as adjunctive therapy. - Prescribed azelastine  nasal spray to be initiated if symptoms do not improve after one week, in addition to ongoing treatments. - Provided anticipatory guidance to continue the regimen for a full week even if symptoms improve sooner. - Instructed to contact the office if symptoms persist beyond one week, worsen, or if new symptoms such as fever or dyspnea develop.  Obesity Chronic obesity managed with anti-obesity pharmacotherapy. Insurance coverage for Wegovy  (semaglutide ) resumed. She is agreeable  to restarting therapy. - Prescribed Wegovy   (semaglutide ) for weight management with instructions regarding monthly dose escalation and injection technique. - Advised to monitor bowel movements and use a supplement if constipation occurs. - Scheduled follow-up in one month to coincide with physical examination and review of medication response. - Planned laboratory evaluation (including cholesterol and iron  studies) to be coordinated with upcoming hematology/cancer center visit in February to minimize phlebotomy frequency.        Assessment & Plan Acute non-recurrent maxillary sinusitis  Orders:   mometasone  (NASONEX ) 50 MCG/ACT nasal spray; One spray in each nostril twice a day, use left hand for right nostril, and right hand for left nostril.  Please dispense one bottle.   promethazine -dextromethorphan (PROMETHAZINE -DM) 6.25-15 MG/5ML syrup; Take 5 mLs by mouth 4 (four) times daily as needed for cough.   azelastine  (ASTELIN ) 0.1 % nasal spray; Place 2 sprays into both nostrils 2 (two) times daily. Use in each nostril as directed  Class 3 severe obesity due to excess calories with serious comorbidity and body mass index (BMI) of 40.0 to 44.9 in adult [facility])  Orders:   semaglutide -weight management (WEGOVY ) 0.25 MG/0.5ML SOAJ SQ injection; Inject 0.25 mg into the skin once a week for 28 days.  Healthcare maintenance  Orders:   CBC   Comprehensive metabolic panel with GFR   Hemoglobin A1c   Lipid panel   No follow-ups on file.     Kara JINNY Ku, MD, [facility]   Primary Care Sports Medicine Primary Care and Sports Medicine at [facility]   "

## 2024-10-26 NOTE — Assessment & Plan Note (Signed)
" °  Orders:   CBC   Comprehensive metabolic panel with GFR   Hemoglobin A1c   Lipid panel  "

## 2024-10-26 NOTE — Patient Instructions (Signed)
" °  VISIT SUMMARY: Today, we addressed your persistent upper respiratory symptoms and ongoing management of obesity.  YOUR PLAN: ACUTE SINUSITIS: You have ongoing sinus inflammation causing symptoms like cough, sore throat, nasal congestion, and cheek pressure. -Use mometasone  nasal spray, two sprays in each nostril twice daily after using saline nasal spray. -Take over-the-counter guaifenesin  twice daily and increase your fluid intake. -Use dextromethorphan/promethazine  syrup as needed, especially for nighttime cough. -Continue using a humidifier and consider over-the-counter antihistamines and vitamin C, vitamin D , and zinc supplements. -If symptoms do not improve after one week, start using azelastine  nasal spray in addition to the ongoing treatments. -Follow this regimen for a full week even if you feel better sooner. -Contact our office if symptoms persist beyond one week, worsen, or if you develop new symptoms like fever or difficulty breathing.  OBESITY: We are managing your chronic obesity with medication. -Restart Wegovy  (semaglutide ) for weight management, following the instructions for monthly dose escalation and injection technique. -Monitor your bowel movements and use a supplement if you experience constipation. -We will follow up in one month to review your progress and conduct a physical examination. -We will coordinate your laboratory evaluation with your upcoming hematology/cancer center visit in February to minimize the number of blood draws.                      Contains text generated by [employer].                                 Contains text generated by [employer].   "

## 2024-10-26 NOTE — Assessment & Plan Note (Signed)
" °  Orders:   mometasone  (NASONEX ) 50 MCG/ACT nasal spray; One spray in each nostril twice a day, use left hand for right nostril, and right hand for left nostril.  Please dispense one bottle.   promethazine -dextromethorphan (PROMETHAZINE -DM) 6.25-15 MG/5ML syrup; Take 5 mLs by mouth 4 (four) times daily as needed for cough.   azelastine  (ASTELIN ) 0.1 % nasal spray; Place 2 sprays into both nostrils 2 (two) times daily. Use in each nostril as directed  "

## 2024-10-26 NOTE — Assessment & Plan Note (Signed)
" °  Orders:   semaglutide -weight management (WEGOVY ) 0.25 MG/0.5ML SOAJ SQ injection; Inject 0.25 mg into the skin once a week for 28 days.  "

## 2024-11-01 ENCOUNTER — Ambulatory Visit: Admitting: Family Medicine

## 2024-11-17 ENCOUNTER — Telehealth: Payer: Self-pay | Admitting: Oncology

## 2024-11-17 NOTE — Telephone Encounter (Signed)
 Pt called to r/s appts from 2/9 and 2/16, pt understood that the next available for MD appts are in march. All appts are r/s and new dates/times confirmed.

## 2024-11-20 ENCOUNTER — Inpatient Hospital Stay: Attending: Oncology

## 2024-11-27 ENCOUNTER — Inpatient Hospital Stay

## 2024-11-27 ENCOUNTER — Inpatient Hospital Stay: Admitting: Oncology

## 2024-12-01 ENCOUNTER — Encounter: Admitting: Family Medicine

## 2024-12-12 ENCOUNTER — Ambulatory Visit: Admitting: Dermatology

## 2024-12-18 ENCOUNTER — Inpatient Hospital Stay

## 2024-12-25 ENCOUNTER — Inpatient Hospital Stay: Admitting: Oncology

## 2024-12-25 ENCOUNTER — Inpatient Hospital Stay
